# Patient Record
Sex: Female | Born: 1995 | Race: White | Hispanic: No | Marital: Single | Smoking: Never smoker
Health system: Southern US, Community
[De-identification: ages and names within clinical notes are randomized; demographics above are authoritative.]

## PROBLEM LIST (undated history)

## (undated) DIAGNOSIS — I514 Myocarditis, unspecified: Secondary | ICD-10-CM

## (undated) HISTORY — DX: Myocarditis, unspecified: I51.4

## (undated) HISTORY — PX: KNEE ARTHROSCOPY WITH ANTERIOR CRUCIATE LIGAMENT (ACL) REPAIR: SHX5644

---

## 2019-09-28 ENCOUNTER — Emergency Department: Payer: 59

## 2019-09-28 ENCOUNTER — Observation Stay
Admission: EM | Admit: 2019-09-28 | Discharge: 2019-09-29 | Disposition: A | Discharge status: Home / Self Care | Payer: 59 | Attending: Internal Medicine | Admitting: Internal Medicine

## 2019-09-28 ENCOUNTER — Observation Stay
Admit: 2019-09-28 | Discharge: 2019-09-28 | Disposition: A | Discharge status: Home / Self Care | Payer: 59 | Attending: Rehabilitative and Restorative Service Providers" | Admitting: Rehabilitative and Restorative Service Providers"

## 2019-09-28 ENCOUNTER — Other Ambulatory Visit: Payer: Self-pay

## 2019-09-28 DIAGNOSIS — I409 Acute myocarditis, unspecified: Secondary | ICD-10-CM | POA: Diagnosis not present

## 2019-09-28 DIAGNOSIS — Z20822 Contact with and (suspected) exposure to covid-19: Secondary | ICD-10-CM | POA: Insufficient documentation

## 2019-09-28 DIAGNOSIS — M7918 Myalgia, other site: Secondary | ICD-10-CM | POA: Diagnosis not present

## 2019-09-28 DIAGNOSIS — I514 Myocarditis, unspecified: Principal | ICD-10-CM | POA: Insufficient documentation

## 2019-09-28 DIAGNOSIS — Z79899 Other long term (current) drug therapy: Secondary | ICD-10-CM | POA: Diagnosis not present

## 2019-09-28 DIAGNOSIS — R519 Headache, unspecified: Secondary | ICD-10-CM | POA: Diagnosis not present

## 2019-09-28 DIAGNOSIS — R079 Chest pain, unspecified: Secondary | ICD-10-CM | POA: Diagnosis present

## 2019-09-28 LAB — COMPREHENSIVE METABOLIC PANEL
ALT: 20 U/L (ref 0–44)
AST: 23 U/L (ref 15–41)
Albumin: 3.7 g/dL (ref 3.5–5.0)
Alkaline Phosphatase: 29 U/L — ABNORMAL LOW (ref 38–126)
Anion gap: 9 (ref 5–15)
BUN: 8 mg/dL (ref 6–20)
CO2: 23 mmol/L (ref 22–32)
Calcium: 9 mg/dL (ref 8.9–10.3)
Chloride: 108 mmol/L (ref 98–111)
Creatinine, Ser: 0.79 mg/dL (ref 0.44–1.00)
GFR calc Af Amer: >60 mL/min (ref >60)
GFR calc non Af Amer: >60 mL/min (ref >60)
Glucose, Bld: 103 mg/dL — ABNORMAL HIGH (ref 70–99)
Potassium: 4 mmol/L (ref 3.5–5.1)
Sodium: 140 mmol/L (ref 135–145)
Total Bilirubin: 0.9 mg/dL (ref 0.3–1.2)
Total Protein: 6.2 g/dL — ABNORMAL LOW (ref 6.5–8.1)

## 2019-09-28 LAB — CBC
HCT: 40 % (ref 36.0–46.0)
Hemoglobin: 14 g/dL (ref 12.0–15.0)
MCH: 31.8 pg (ref 26.0–34.0)
MCHC: 35 g/dL (ref 30.0–36.0)
MCV: 90.9 fL (ref 80.0–100.0)
Platelets: 232 10*3/uL (ref 150–400)
RBC: 4.4 MIL/uL (ref 3.87–5.11)
RDW: 11.9 % (ref 11.5–15.5)
WBC: 7.6 10*3/uL (ref 4.0–10.5)
nRBC: 0 % (ref 0.0–0.2)

## 2019-09-28 LAB — URINE DRUG SCREEN, QUALITATIVE (ARMC ONLY)
Amphetamines, Ur Screen: NOT DETECTED
Barbiturates, Ur Screen: NOT DETECTED
Benzodiazepine, Ur Scrn: NOT DETECTED
Cannabinoid 50 Ng, Ur ~~LOC~~: NOT DETECTED
Cocaine Metabolite,Ur ~~LOC~~: NOT DETECTED
MDMA (Ecstasy)Ur Screen: NOT DETECTED
Methadone Scn, Ur: NOT DETECTED
Opiate, Ur Screen: NOT DETECTED
Phencyclidine (PCP) Ur S: NOT DETECTED
Tricyclic, Ur Screen: NOT DETECTED

## 2019-09-28 LAB — PROTIME-INR
INR: 1 (ref 0.8–1.2)
Prothrombin Time: 12.4 seconds (ref 11.4–15.2)

## 2019-09-28 LAB — APTT: aPTT: <24 seconds — ABNORMAL LOW (ref 24–36)

## 2019-09-28 LAB — SARS CORONAVIRUS 2 BY RT PCR (HOSPITAL ORDER, PERFORMED IN ~~LOC~~ HOSPITAL LAB): SARS Coronavirus 2: NEGATIVE

## 2019-09-28 LAB — TROPONIN I (HIGH SENSITIVITY)
Troponin I (High Sensitivity): 888 ng/L (ref <18)
Troponin I (High Sensitivity): 1129 ng/L (ref <18)
Troponin I (High Sensitivity): 1584 ng/L (ref <18)

## 2019-09-28 LAB — SEDIMENTATION RATE: Sed Rate: 6 mm/hr (ref 0–20)

## 2019-09-28 LAB — TSH: TSH: 4.393 u[IU]/mL (ref 0.350–4.500)

## 2019-09-28 LAB — HCG, QUANTITATIVE, PREGNANCY: hCG, Beta Chain, Quant, S: 3 m[IU]/mL (ref <5)

## 2019-09-28 LAB — HIV ANTIBODY (ROUTINE TESTING W REFLEX): HIV Screen 4th Generation wRfx: NONREACTIVE

## 2019-09-28 LAB — C-REACTIVE PROTEIN: CRP: 0.7 mg/dL (ref <1.0)

## 2019-09-28 MED ORDER — SODIUM CHLORIDE 0.9% FLUSH
3.0000 mL | Freq: Two times a day (BID) | INTRAVENOUS | Status: DC
  Administered 2019-09-28: 3 mL via INTRAVENOUS

## 2019-09-28 MED ORDER — HEPARIN BOLUS VIA INFUSION
4000.0000 [IU] | Freq: Once | INTRAVENOUS | Status: DC
  Filled 2019-09-28: qty 4000

## 2019-09-28 MED ORDER — ASPIRIN 81 MG PO CHEW
324.0000 mg | CHEWABLE_TABLET | Freq: Once | ORAL | Status: AC
  Administered 2019-09-28: 324 mg via ORAL
  Filled 2019-09-28: qty 4

## 2019-09-28 MED ORDER — ACETAMINOPHEN 325 MG PO TABS
650.0000 mg | ORAL_TABLET | Freq: Four times a day (QID) | ORAL | Status: DC | PRN
  Administered 2019-09-28: 650 mg via ORAL
  Filled 2019-09-28: qty 2

## 2019-09-28 MED ORDER — SODIUM CHLORIDE 0.9% FLUSH: 10.0000 mL | INTRAVENOUS | Status: DC | PRN

## 2019-09-28 MED ORDER — ASPIRIN EC 81 MG PO TBEC: 81.0000 mg | DELAYED_RELEASE_TABLET | Freq: Every day | ORAL | Status: DC

## 2019-09-28 MED ORDER — ENOXAPARIN SODIUM 40 MG/0.4ML ~~LOC~~ SOLN
40.0000 mg | SUBCUTANEOUS | Status: DC
  Administered 2019-09-29: 40 mg via SUBCUTANEOUS
  Filled 2019-09-28: qty 0.4

## 2019-09-28 MED ORDER — SODIUM CHLORIDE 0.9% FLUSH
10.0000 mL | Freq: Two times a day (BID) | INTRAVENOUS | Status: DC
  Administered 2019-09-28 (×2): 10 mL

## 2019-09-28 MED ORDER — HEPARIN (PORCINE) 25000 UT/250ML-% IV SOLN: 14.0000 [IU]/kg/h | INTRAVENOUS | Status: DC

## 2019-09-28 MED ORDER — ACETAMINOPHEN 650 MG RE SUPP: 650.0000 mg | Freq: Four times a day (QID) | RECTAL | Status: DC | PRN

## 2019-09-28 MED ORDER — IOHEXOL 350 MG/ML SOLN
100.0000 mL | Freq: Once | INTRAVENOUS | Status: AC | PRN
  Administered 2019-09-28: 70 mL via INTRAVENOUS

## 2019-09-28 MED ORDER — SODIUM CHLORIDE 0.9% FLUSH: 3.0000 mL | INTRAVENOUS | Status: DC | PRN

## 2019-09-28 MED ORDER — ONDANSETRON HCL 4 MG/2ML IJ SOLN: 4.0000 mg | Freq: Four times a day (QID) | INTRAMUSCULAR | Status: DC | PRN

## 2019-09-28 MED ORDER — SODIUM CHLORIDE 0.9 % IV SOLN: 250.0000 mL | INTRAVENOUS | Status: DC | PRN

## 2019-09-28 MED ORDER — ONDANSETRON HCL 4 MG PO TABS: 4.0000 mg | ORAL_TABLET | Freq: Four times a day (QID) | ORAL | Status: DC | PRN

## 2019-09-28 MED ORDER — DESOGESTREL-ETHINYL ESTRADIOL 0.15-30 MG-MCG PO TABS: 1.0000 | ORAL_TABLET | ORAL | Status: DC

## 2019-09-28 MED ORDER — HEPARIN (PORCINE) 25000 UT/250ML-% IV SOLN: 950.0000 [IU]/h | INTRAVENOUS | Status: DC

## 2019-09-28 NOTE — H&P (Signed)
History and Physical    Londynn Sonoda AOZ:[id number] DOB: 10/25/1995 DOA: 09/28/2019  PCP: Patient, No Pcp Per   Patient coming from: Home I have personally briefly reviewed patient's old medical records in [facility]  Chief Complaint: Chest pain  HPI: Treena Cosman is a 24 y.o. female with no significant past medical history who presents to the emergency room for evaluation of chest pain.  Chest pain started at about 3 PM 1 day prior to her admission.  Pain was midsternal, rated 8 x 10 in intensity at its worse and was nonradiating.  She denied having any nausea, vomiting, diaphoresis or palpitations.  Patient decided to come to the emergency room to get checked out due to the persistence of her symptoms.  She states that she had moved into a new apartment the day prior and thought her pain was muscular, the left.  She has no lower extremity pain or swelling.  She had a CT angiogram of the chest which was negative for PE. High intensity troponin x2 was elevated 88 >> 1129 Patient has had myalgias and headaches but denies having any fever or chills.  She states that she was vaccinated in March against the COVID-19 virus and was infected with a virus about a year ago. She has no family history of autoimmune disease, no family history of venous thromboembolic disease or coronary artery disease. At the time of my history and physical she is chest pain-free. Twelve-lead EKG reviewed by me shows sinus rhythm Chest x-ray reviewed by me shows no acute abnormality.  ED Course: Patient is a 24 year old Caucasian female who presents to the emergency room for evaluation of midsternal chest pain that has been persistent.  Patient noted to have elevated troponin levels and will be admitted to the hospital for possible myocarditis.  Cardiology consult has been placed.  Review of Systems: As per HPI otherwise 10 point review of systems negative.    History reviewed. No pertinent past medical  history.   No Known Allergies  Family History  Problem Relation Age of Onset  . Hypertension Mother   . Hypertension Father      Prior to Admission medications   Medication Sig Start Date End Date Taking? Authorizing Provider  desogestrel-ethinyl estradiol (APRI) 0.15-30 MG-MCG tablet Take 1 tablet by mouth as directed.   Yes [provider]    Physical Exam: Vitals:   09/28/19 0715 09/28/19 0815 09/28/19 0830 09/28/19 0950  BP: (!) 116/58 (!) 108/92 (!) 112/61   Pulse: 60 72 65 56  Resp: 20 21 20 19   Temp:      TempSrc:      SpO2: 99% 100% 100% 100%  Weight:      Height:         Vitals:   09/28/19 0715 09/28/19 0815 09/28/19 0830 09/28/19 0950  BP: (!) 116/58 (!) 108/92 (!) 112/61   Pulse: 60 72 65 56  Resp: 20 21 20 19   Temp:      TempSrc:      SpO2: 99% 100% 100% 100%  Weight:      Height:        Constitutional: NAD, alert and oriented x 3 Eyes: PERRL, lids and conjunctivae normal ENMT: Mucous membranes are moist.  Neck: normal, supple, no masses, no thyromegaly Respiratory: clear to auscultation bilaterally, no wheezing, no crackles. Normal respiratory effort. No accessory muscle use.  Cardiovascular: Regular rate and rhythm, no murmurs / rubs / gallops. No extremity edema. 2+ pedal pulses.  No carotid bruits.  Chest pain is nonreproducible Abdomen: no tenderness, no masses palpated. No hepatosplenomegaly. Bowel sounds positive.  Musculoskeletal: no clubbing / cyanosis. No joint deformity upper and lower extremities.  Skin: no rashes, lesions, ulcers.  Neurologic: No gross focal neurologic deficit. Psychiatric: Normal mood and affect.   Labs on Admission: I have personally reviewed following labs and imaging studies  CBC: Recent Labs  Lab 09/28/19 0050  WBC 7.6  HGB 14.0  HCT 40.0  MCV 90.9  PLT 232   Basic Metabolic Panel: Recent Labs  Lab 09/28/19 0050  NA 140  K 4.0  CL 108  CO2 23  GLUCOSE 103*  BUN 8  CREATININE 0.79   CALCIUM 9.0   GFR: Estimated Creatinine Clearance: 107.3 mL/min (by C-G formula based on SCr of 0.79 mg/dL). Liver Function Tests: Recent Labs  Lab 09/28/19 0050  AST 23  ALT 20  ALKPHOS 29*  BILITOT 0.9  PROT 6.2*  ALBUMIN 3.7   No results for input(s): LIPASE, AMYLASE in the last 168 hours. No results for input(s): AMMONIA in the last 168 hours. Coagulation Profile: Recent Labs  Lab 09/28/19 0842  INR 1.0   Cardiac Enzymes: No results for input(s): CKTOTAL, CKMB, CKMBINDEX, TROPONINI in the last 168 hours. BNP (last 3 results) No results for input(s): PROBNP in the last 8760 hours. HbA1C: No results for input(s): HGBA1C in the last 72 hours. CBG: No results for input(s): GLUCAP in the last 168 hours. Lipid Profile: No results for input(s): CHOL, HDL, LDLCALC, TRIG, CHOLHDL, LDLDIRECT in the last 72 hours. Thyroid Function Tests: No results for input(s): TSH, T4TOTAL, FREET4, T3FREE, THYROIDAB in the last 72 hours. Anemia Panel: No results for input(s): VITAMINB12, FOLATE, FERRITIN, TIBC, IRON, RETICCTPCT in the last 72 hours. Urine analysis: No results found for: COLORURINE, APPEARANCEUR, LABSPEC, PHURINE, GLUCOSEU, HGBUR, BILIRUBINUR, KETONESUR, PROTEINUR, UROBILINOGEN, NITRITE, LEUKOCYTESUR  Radiological Exams on Admission: DG Chest 2 View  Result Date: 09/28/2019 CLINICAL DATA:  Headache and body aches, chest pain EXAM: CHEST - 2 VIEW COMPARISON:  None. FINDINGS: The heart size and mediastinal contours are within normal limits. Both lungs are clear. The visualized skeletal structures are unremarkable. IMPRESSION: No active cardiopulmonary disease. Electronically Signed   By: Sharlet Salina M.D.   On: 09/28/2019 01:19   CT Angio Chest PE W and/or Wo Contrast  Result Date: 09/28/2019 CLINICAL DATA:  Acute onset of chest pain this morning. EXAM: CT ANGIOGRAPHY CHEST WITH CONTRAST TECHNIQUE: Multidetector CT imaging of the chest was performed using the standard  protocol during bolus administration of intravenous contrast. Multiplanar CT image reconstructions and MIPs were obtained to evaluate the vascular anatomy. CONTRAST:  12mL OMNIPAQUE IOHEXOL 350 MG/ML SOLN COMPARISON:  Chest x-ray, same date. FINDINGS: Cardiovascular: The heart is normal in size. No pericardial effusion. The aorta is normal in caliber. No dissection. The pulmonary arterial tree is well opacified. No filling defects to suggest pulmonary embolism. Mediastinum/Nodes: No mediastinal or hilar mass or adenopathy. The esophagus is unremarkable. Lungs/Pleura: No acute pulmonary findings. No pulmonary lesions or pleural effusion. Upper Abdomen: No significant upper abdominal findings. Musculoskeletal: No significant bony findings. Review of the MIP images confirms the above findings. IMPRESSION: 1. No CT findings for pulmonary embolism. 2. Normal thoracic aorta. 3. No acute pulmonary findings or worrisome pulmonary lesions. Electronically Signed   By: Rudie Meyer M.D.   On: 09/28/2019 05:38    EKG: Independently reviewed.  Sinus rhythm  Assessment/Plan Principal Problem:   Myocarditis (HCC)  Myocarditis Unclear etiology may be viral Continue supportive care with nonsteroidal anti-inflammatory agents and Tylenol as needed for myalgias We will request cardiology consult Place patient on daily aspirin We will obtain work-up for possible autoimmune disease Obtain 2D echocardiogram to assess LVEF  DVT prophylaxis: Lovenox Code Status: Full Family Communication: Greater than 50% of time was spent discussing plan of care with patient at the bedside.  She verbalizes understanding and agrees with the plan Disposition Plan: Back to previous home environment Consults called: Cardiology    Hardeep Reetz MD [facility]     09/28/2019, 9:58 AM

## 2019-09-28 NOTE — ED Notes (Signed)
Pt cleared by ER MD to have ice chips. Pt given ice chips by this RN at this time

## 2019-09-28 NOTE — Progress Notes (Signed)
Report called to [facility] on 2A.

## 2019-09-28 NOTE — ED Notes (Signed)
Called lab for ETA on repeat Trop I, unable to give me a time.

## 2019-09-28 NOTE — Progress Notes (Signed)
ANTICOAGULATION CONSULT NOTE - Initial Consult  Pharmacy Consult for Heparin Indication: ACS/STEMI  No Known Allergies  Patient Measurements: Height: 5\' 7"  (170.2 cm) Weight: 68 kg (150 lb) IBW/kg (Calculated) : 61.6 HEPARIN DW (KG): 68  Vital Signs: Temp: 99.3 F (37.4 C) (07/23 0047) Temp Source: Oral (07/23 0047) BP: 126/76 (07/23 0432) Pulse Rate: 90 (07/23 0432)  Labs: Recent Labs    09/28/19 0050 09/28/19 0321  HGB 14.0  --   HCT 40.0  --   PLT 232  --   CREATININE 0.79  --   TROPONINIHS 888* 1,129*    Estimated Creatinine Clearance: 107.3 mL/min (by C-G formula based on SCr of 0.79 mg/dL).   Medical History: History reviewed. No pertinent past medical history.  Medications:  (Not in a hospital admission)   Assessment: No anticoagulants PTA.  Baseline labs ordered.  Initiating Heparin for ACS.  Goal of Therapy:  Heparin level 0.3-0.7 units/ml Monitor platelets by anticoagulation protocol: Yes   Plan:  Heparin 4000 units x 1 then infusion at 950 units/hr Check HL 6 hours after heparin started  09/30/19 A 09/28/2019,5:56 AM

## 2019-09-28 NOTE — ED Provider Notes (Signed)
[facility] Emergency Department Provider Note  ____________________________________________   First MD Initiated Contact with Patient 09/28/19 0151     (approximate)  I have reviewed the triage vital signs and the nursing notes.   HISTORY  Chief Complaint Chest Pain   HPI Carol Moody is a 24 y.o. female with previous history of COVID-19 infection last year, receive COVID-19 vaccine March of this year presents to the emergency department with a 1 day history of generalized body aches and headache overall myalgia.  Patient states that 3:00 PM today she started to experience chest pain which she describes as tightness current pain score of 3 out of 10.  Patient denies any cardiac history.  Patient denies any personal familial history of ACS, DVT  or PE.  Patient denies any fever.  Oral temperature 99.3 on arrival to the emergency department.  Patient denies any lower extremity pain or swelling.        Past medical history COVID-19 infection 1 year ago There are no problems to display for this patient.     Prior to Admission medications   Medication Sig Start Date End Date Taking? Authorizing Provider  desogestrel-ethinyl estradiol (APRI) 0.15-30 MG-MCG tablet Take 1 tablet by mouth as directed.   Yes [provider]    Allergies Patient has no known allergies.  No family history on file.  Social History Social History   Tobacco Use  . Smoking status: Not on file  Substance Use Topics  . Alcohol use: Not on file  . Drug use: Not on file    Review of Systems Constitutional: No fever/chills Eyes: No visual changes. ENT: No sore throat. Cardiovascular: Denies chest pain. Respiratory: Denies shortness of breath. Gastrointestinal: No abdominal pain.  No nausea, no vomiting.  No diarrhea.  No constipation. Genitourinary: Negative for dysuria. Musculoskeletal: Negative for neck pain.  Negative for back pain. Integumentary: Negative  for rash. Neurological: Negative for headaches, focal weakness or numbness.   ____________________________________________   PHYSICAL EXAM:  VITAL SIGNS: ED Triage Vitals [09/28/19 0047]  Enc Vitals Group     BP (!) 123/88     Pulse Rate 70     Resp 20     Temp 99.3 F (37.4 C)     Temp Source Oral     SpO2 99 %     Weight 68 kg (150 lb)     Height 1.702 m (5\' 7" )     Head Circumference      Peak Flow      Pain Score 3     Pain Loc      Pain Edu?      Excl. in GC?     Constitutional: Alert and oriented.  Eyes: Conjunctivae are normal.  Head: Atraumatic. Mouth/Throat: Patient is wearing a mask. Neck: No stridor.  No meningeal signs.   Cardiovascular: Normal rate, regular rhythm. Good peripheral circulation. Grossly normal heart sounds. Respiratory: Normal respiratory effort.  No retractions. Gastrointestinal: Soft and nontender. No distention.  Musculoskeletal: No lower extremity tenderness nor edema. No gross deformities of extremities. Neurologic:  Normal speech and language. No gross focal neurologic deficits are appreciated.  Skin:  Skin is warm, dry and intact. Psychiatric: Mood and affect are normal. Speech and behavior are normal.  ____________________________________________   LABS (all labs ordered are listed, but only abnormal results are displayed)  Labs Reviewed  COMPREHENSIVE METABOLIC PANEL - Abnormal; Notable for the following components:      Result Value  Glucose, Bld 103 (*)    Total Protein 6.2 (*)    Alkaline Phosphatase 29 (*)    All other components within normal limits  TROPONIN I (HIGH SENSITIVITY) - Abnormal; Notable for the following components:   Troponin I (High Sensitivity) 888 (*)    All other components within normal limits  TROPONIN I (HIGH SENSITIVITY) - Abnormal; Notable for the following components:   Troponin I (High Sensitivity) 1,129 (*)    All other components within normal limits  SARS CORONAVIRUS 2 BY RT PCR  (HOSPITAL ORDER, PERFORMED IN [facility])  CBC  HCG, QUANTITATIVE, PREGNANCY  APTT  PROTIME-INR   ____________________________________________  EKG  ED ECG REPORT I, [place] Marvelene Stoneberg, the attending physician, personally viewed and interpreted this ECG.   Date: 09/28/2019  EKG Time: 2:07 AM  Rate: 88  Rhythm: Normal sinus rhythm  Axis: Normal  Intervals: Normal  ST&T Change: None  ____________________________________________  RADIOLOGY I, [place] N Dorin [place], personally viewed and evaluated these images (plain radiographs) as part of my medical decision making, as well as reviewing the written report by the radiologist.  ED MD interpretation: No active cardiopulmonary disease noted on chest x-ray per radiologist  CT angiogram chest also negative  Official radiology report(s): DG Chest 2 View  Result Date: 09/28/2019 CLINICAL DATA:  Headache and body aches, chest pain EXAM: CHEST - 2 VIEW COMPARISON:  None. FINDINGS: The heart size and mediastinal contours are within normal limits. Both lungs are clear. The visualized skeletal structures are unremarkable. IMPRESSION: No active cardiopulmonary disease. Electronically Signed   By: Sharlet Salina M.D.   On: 09/28/2019 01:19   CT Angio Chest PE W and/or Wo Contrast  Result Date: 09/28/2019 CLINICAL DATA:  Acute onset of chest pain this morning. EXAM: CT ANGIOGRAPHY CHEST WITH CONTRAST TECHNIQUE: Multidetector CT imaging of the chest was performed using the standard protocol during bolus administration of intravenous contrast. Multiplanar CT image reconstructions and MIPs were obtained to evaluate the vascular anatomy. CONTRAST:  32mL OMNIPAQUE IOHEXOL 350 MG/ML SOLN COMPARISON:  Chest x-ray, same date. FINDINGS: Cardiovascular: The heart is normal in size. No pericardial effusion. The aorta is normal in caliber. No dissection. The pulmonary arterial tree is well opacified. No filling defects to suggest pulmonary embolism.  Mediastinum/Nodes: No mediastinal or hilar mass or adenopathy. The esophagus is unremarkable. Lungs/Pleura: No acute pulmonary findings. No pulmonary lesions or pleural effusion. Upper Abdomen: No significant upper abdominal findings. Musculoskeletal: No significant bony findings. Review of the MIP images confirms the above findings. IMPRESSION: 1. No CT findings for pulmonary embolism. 2. Normal thoracic aorta. 3. No acute pulmonary findings or worrisome pulmonary lesions. Electronically Signed   By: Rudie Meyer M.D.   On: 09/28/2019 05:38      Procedures   ____________________________________________   INITIAL IMPRESSION / MDM / ASSESSMENT AND PLAN / ED COURSE  As part of my medical decision making, I reviewed the following data within the electronic MEDICAL RECORD NUMBER   24 year old female presented with above-stated history and physical exam a differential diagnosis including but not limited to myocarditis, pericarditis, ACS, pulmonary emboli.  Laboratory data notable for elevated troponin of 888.  EKG revealed no evidence of ischemia or infarction.  CT scan revealed no evidence of pulmonary emboli or any other gross abnormality.  Repeat troponin obtained was 1129.  Patient discussed with Dr. Lady Gary cardiologist on-call.  Dr. Lady Gary advised no heparin at this time.  Patient discussed with Dr. Para March for hospital admission  further evaluation and management.  ____________________________________________  FINAL CLINICAL IMPRESSION(S) / ED DIAGNOSES  Final diagnoses:  Acute myocarditis, unspecified myocarditis type     MEDICATIONS GIVEN DURING THIS VISIT:  Medications  sodium chloride flush (NS) 0.9 % injection 10-40 mL (has no administration in time range)  sodium chloride flush (NS) 0.9 % injection 10-40 mL (has no administration in time range)  iohexol (OMNIPAQUE) 350 MG/ML injection 100 mL (70 mLs Intravenous Contrast Given 09/28/19 0449)  aspirin chewable tablet 324 mg (324 mg Oral  Given 09/28/19 0515)     ED Discharge Orders    None      *Please note:  Spencer Peterkin was evaluated in Emergency Department on 09/28/2019 for the symptoms described in the history of present illness. She was evaluated in the context of the global COVID-19 pandemic, which necessitated consideration that the patient might be at risk for infection with the SARS-CoV-2 virus that causes COVID-19. Institutional protocols and algorithms that pertain to the evaluation of patients at risk for COVID-19 are in a state of rapid change based on information released by regulatory bodies including the CDC and federal and state organizations. These policies and algorithms were followed during the patient's care in the ED.  Some ED evaluations and interventions may be delayed as a result of limited staffing during and after the pandemic.*  Note:  This document was prepared using Dragon voice recognition software and may include unintentional dictation errors.   Darci Current, MD 09/28/19 [phone]

## 2019-09-28 NOTE — ED Triage Notes (Signed)
Pt in with co chest pain since 1500 today, no cardiac hx. States has had headache and body aches recently. Denies any fever or respiratory symptoms.

## 2019-09-28 NOTE — Progress Notes (Signed)
Spoke with [facility], Patients Boyfriend Loralie Champagne to stay over One night. They are aware of this and that is for only one night

## 2019-09-28 NOTE — Consult Note (Signed)
CARDIOLOGY CONSULT NOTE               Patient ID: Carol Moody MRN: [id number] DOB/AGE: [birth year] 24 y.o.  Admit date: 09/28/2019 Referring Physician Dr. Bayard Males  Primary Physician n/a Primary Cardiologist n/a Reason for Consultation Chest pain, elevated troponin   HPI: Carol Moody is a 24 year old female with a past medical history significant for COVID-19 infection who presented to the ED on 09/28/19 for a 1 day history of generalized fatigue with associated chest tightness.  She reports waking up yesterday morning, feeling "a little off" and then developed chest pain around 3pm yesterday afternoon.  Workup in the ED has been significant for high sensitivity troponin elevated x 2 at 88 and 1129 respectively, ECG revealing sinus rhythm with no acute ischemic changes, chest xray negative for acute cardiopulmonary disease, and chest CT negative for an acute PE.   On exam, Carol Moody is currently asymptomatic and reports complete resolution of chest pain a few hours after onset.  She was given aspirin but no other medications.  She denies known sick contacts, but recently traveled here from [place].  She denies lower extremity swelling, orthopnea, or PND.  She denies palpitations or heart racing sensation.  She denies dizziness, lightheadedness, or syncopal/presyncopal episodes.   Review of systems complete and found to be negative unless listed above     History reviewed. No pertinent past medical history.    (Not in a hospital admission)  Social History   Socioeconomic History  . Marital status: Single    Spouse name: Not on file  . Number of children: Not on file  . Years of education: Not on file  . Highest education level: Not on file  Occupational History  . Not on file  Tobacco Use  . Smoking status: Not on file  Substance and Sexual Activity  . Alcohol use: Not on file  . Drug use: Not on file  . Sexual activity: Not on file  Other Topics Concern  . Not on  file  Social History Narrative  . Not on file   Social Determinants of Health   Financial Resource Strain:   . Difficulty of Paying Living Expenses:   Food Insecurity:   . Worried About Programme researcher, broadcasting/film/video in the Last Year:   . Barista in the Last Year:   Transportation Needs:   . Freight forwarder (Medical):   Marland Kitchen Lack of Transportation (Non-Medical):   Physical Activity:   . Days of Exercise per Week:   . Minutes of Exercise per Session:   Stress:   . Feeling of Stress :   Social Connections:   . Frequency of Communication with Friends and Family:   . Frequency of Social Gatherings with Friends and Family:   . Attends Religious Services:   . Active Member of Clubs or Organizations:   . Attends Banker Meetings:   Marland Kitchen Marital Status:   Intimate Partner Violence:   . Fear of Current or Ex-Partner:   . Emotionally Abused:   Marland Kitchen Physically Abused:   . Sexually Abused:     No family history on file.    Review of systems complete and found to be negative unless listed above      PHYSICAL EXAM  General: Well developed, well nourished, in no acute distress HEENT:  Normocephalic and atramatic Neck:  No JVD.  Lungs: Clear bilaterally to auscultation and percussion. Heart: HRRR . Normal S1 and  S2 without gallops or murmurs.  Abdomen: Bowel sounds are positive, abdomen soft and non-tender  Msk:  Back normal. Normal strength and tone for age. Extremities: No clubbing, cyanosis or edema.   Neuro: Alert and oriented X 3. Psych:  Good affect, responds appropriately  Labs:   Lab Results  Component Value Date   WBC 7.6 09/28/2019   HGB 14.0 09/28/2019   HCT 40.0 09/28/2019   MCV 90.9 09/28/2019   PLT 232 09/28/2019    Recent Labs  Lab 09/28/19 0050  NA 140  K 4.0  CL 108  CO2 23  BUN 8  CREATININE 0.79  CALCIUM 9.0  PROT 6.2*  BILITOT 0.9  ALKPHOS 29*  ALT 20  AST 23  GLUCOSE 103*   No results found for: CKTOTAL, CKMB, CKMBINDEX,  TROPONINI No results found for: CHOL No results found for: HDL No results found for: LDLCALC No results found for: TRIG No results found for: CHOLHDL No results found for: LDLDIRECT    Radiology: DG Chest 2 View  Result Date: 09/28/2019 CLINICAL DATA:  Headache and body aches, chest pain EXAM: CHEST - 2 VIEW COMPARISON:  None. FINDINGS: The heart size and mediastinal contours are within normal limits. Both lungs are clear. The visualized skeletal structures are unremarkable. IMPRESSION: No active cardiopulmonary disease. Electronically Signed   By: Sharlet Salina M.D.   On: 09/28/2019 01:19   CT Angio Chest PE W and/or Wo Contrast  Result Date: 09/28/2019 CLINICAL DATA:  Acute onset of chest pain this morning. EXAM: CT ANGIOGRAPHY CHEST WITH CONTRAST TECHNIQUE: Multidetector CT imaging of the chest was performed using the standard protocol during bolus administration of intravenous contrast. Multiplanar CT image reconstructions and MIPs were obtained to evaluate the vascular anatomy. CONTRAST:  45mL OMNIPAQUE IOHEXOL 350 MG/ML SOLN COMPARISON:  Chest x-ray, same date. FINDINGS: Cardiovascular: The heart is normal in size. No pericardial effusion. The aorta is normal in caliber. No dissection. The pulmonary arterial tree is well opacified. No filling defects to suggest pulmonary embolism. Mediastinum/Nodes: No mediastinal or hilar mass or adenopathy. The esophagus is unremarkable. Lungs/Pleura: No acute pulmonary findings. No pulmonary lesions or pleural effusion. Upper Abdomen: No significant upper abdominal findings. Musculoskeletal: No significant bony findings. Review of the MIP images confirms the above findings. IMPRESSION: 1. No CT findings for pulmonary embolism. 2. Normal thoracic aorta. 3. No acute pulmonary findings or worrisome pulmonary lesions. Electronically Signed   By: Rudie Meyer M.D.   On: 09/28/2019 05:38    EKG: Normal sinus rhythm at a ventricular rate of 88bpm with no evidence  of acute ischemia, arrhythmias, or ectopy.   ASSESSMENT AND PLAN:  1.  Chest pain/elevated troponin   -Likely myocarditis in the setting of a potential viral infection with reports of generalized fatigue, body aches  -Will obtain echocardiogram to assess LV function   -Recommend to remain on telemetry   The history, physical exam findings, and plan of care were all discussed with Dr. Harold Hedge, and all decision making was made in collaboration.   Signed: Andi Hence PA-C 09/28/2019, 8:04 AM

## 2019-09-29 ENCOUNTER — Encounter: Payer: Self-pay | Admitting: Internal Medicine

## 2019-09-29 DIAGNOSIS — I409 Acute myocarditis, unspecified: Secondary | ICD-10-CM | POA: Diagnosis not present

## 2019-09-29 LAB — BASIC METABOLIC PANEL
Anion gap: 9 (ref 5–15)
BUN: 7 mg/dL (ref 6–20)
CO2: 23 mmol/L (ref 22–32)
Calcium: 8.7 mg/dL — ABNORMAL LOW (ref 8.9–10.3)
Chloride: 108 mmol/L (ref 98–111)
Creatinine, Ser: 0.65 mg/dL (ref 0.44–1.00)
GFR calc Af Amer: >60 mL/min (ref >60)
GFR calc non Af Amer: >60 mL/min (ref >60)
Glucose, Bld: 95 mg/dL (ref 70–99)
Potassium: 3.6 mmol/L (ref 3.5–5.1)
Sodium: 140 mmol/L (ref 135–145)

## 2019-09-29 LAB — ANA: Anti Nuclear Antibody (ANA): NEGATIVE

## 2019-09-29 LAB — CBC
HCT: 42.3 % (ref 36.0–46.0)
Hemoglobin: 14.7 g/dL (ref 12.0–15.0)
MCH: 31.7 pg (ref 26.0–34.0)
MCHC: 34.8 g/dL (ref 30.0–36.0)
MCV: 91.2 fL (ref 80.0–100.0)
Platelets: 234 10*3/uL (ref 150–400)
RBC: 4.64 MIL/uL (ref 3.87–5.11)
RDW: 12.2 % (ref 11.5–15.5)
WBC: 7.5 10*3/uL (ref 4.0–10.5)
nRBC: 0 % (ref 0.0–0.2)

## 2019-09-29 LAB — ECHOCARDIOGRAM COMPLETE
Area-P 1/2: 2.35 cm2
Height: 67 in
S' Lateral: 3.12 cm
Weight: 2419.2 oz

## 2019-09-29 LAB — TROPONIN I (HIGH SENSITIVITY): Troponin I (High Sensitivity): 1456 ng/L (ref <18)

## 2019-09-29 MED ORDER — IBUPROFEN 400 MG PO TABS
400.0000 mg | ORAL_TABLET | Freq: Four times a day (QID) | ORAL | Status: AC | PRN
Start: 2019-09-29 — End: ?

## 2019-09-29 NOTE — Progress Notes (Signed)
Troponin 1456 this am improved from previous. OK for discharge.

## 2019-09-29 NOTE — Progress Notes (Signed)
Patient Name: Carol Moody Date of Encounter: 09/29/2019  Hospital Problem List     Principal Problem:   Myocarditis [facility])    Patient Profile     24 year old female with a past medical history significant for COVID-19 infection who presented to the ED on 09/28/19 for a 1 day history of generalized fatigue with associated chest tightness.  She reports waking up yesterday morning, feeling "a little off" and then developed chest pain around 3pm yesterday afternoon.  Workup in the ED has been significant for high sensitivity troponin elevated x 2 at 88 and 1129 respectively, ECG revealing sinus rhythm with no acute ischemic changes, chest xray negative for acute cardiopulmonary disease, and chest CT negative for an acute PE.   She denies known sick contacts, but recently traveled here from [place].  She denies lower extremity swelling, orthopnea, or PND.  She denies palpitations or heart racing sensation.  She denies dizziness, lightheadedness, or syncopal/presyncopal episodes.   Subjective   Mild positional chest heaviness.  Inpatient Medications    . aspirin EC  81 mg Oral Daily  . desogestrel-ethinyl estradiol  1 tablet Oral UD  . enoxaparin (LOVENOX) injection  40 mg Subcutaneous Q24H  . sodium chloride flush  10-40 mL Intracatheter Q12H  . sodium chloride flush  3 mL Intravenous Q12H    Vital Signs    Vitals:   09/28/19 2136 09/29/19 0220 09/29/19 0221 09/29/19 0754  BP: 126/70  122/72 116/75  Pulse: 68  57 57  Resp: 20  18 18   Temp: 99.7 F (37.6 C)  99.1 F (37.3 C) 98.8 F (37.1 C)  TempSrc: Oral  Oral Oral  SpO2:   99% 99%  Weight: 68.6 kg 68.5 kg    Height: 5\' 7"  (1.702 m)       Intake/Output Summary (Last 24 hours) at 09/29/2019 0816 Last data filed at 09/29/2019 0000 Gross per 24 hour  Intake 10 ml  Output 0 ml  Net 10 ml   Filed Weights   09/28/19 0047 09/28/19 2136 09/29/19 0220  Weight: 68 kg 68.6 kg 68.5 kg    Physical Exam    GEN: Well nourished,  well developed, in no acute distress.  HEENT: normal.  Neck: Supple, no JVD, carotid bruits, or masses. Cardiac: RRR, no murmurs, rubs, or gallops. No clubbing, cyanosis, edema.  Radials/DP/PT 2+ and equal bilaterally.  Respiratory:  Respirations regular and unlabored, clear to auscultation bilaterally. GI: Soft, nontender, nondistended, BS + x 4. MS: no deformity or atrophy. Skin: warm and dry, no rash. Neuro:  Strength and sensation are intact. Psych: Normal affect.  Labs    CBC Recent Labs    09/28/19 0050 09/29/19 0531  WBC 7.6 7.5  HGB 14.0 14.7  HCT 40.0 42.3  MCV 90.9 91.2  PLT 232 234   Basic Metabolic Panel Recent Labs    09/30/19 0050 09/29/19 0531  NA 140 140  K 4.0 3.6  CL 108 108  CO2 23 23  GLUCOSE 103* 95  BUN 8 7  CREATININE 0.79 0.65  CALCIUM 9.0 8.7*   Liver Function Tests Recent Labs    09/28/19 0050  AST 23  ALT 20  ALKPHOS 29*  BILITOT 0.9  PROT 6.2*  ALBUMIN 3.7   No results for input(s): LIPASE, AMYLASE in the last 72 hours. Cardiac Enzymes No results for input(s): CKTOTAL, CKMB, CKMBINDEX, TROPONINI in the last 72 hours. BNP No results for input(s): BNP in the last 72 hours. D-Dimer  No results for input(s): DDIMER in the last 72 hours. Hemoglobin A1C No results for input(s): HGBA1C in the last 72 hours. Fasting Lipid Panel No results for input(s): CHOL, HDL, LDLCALC, TRIG, CHOLHDL, LDLDIRECT in the last 72 hours. Thyroid Function Tests Recent Labs    09/28/19 1507  TSH 4.393    Telemetry    Normal sinus rhythm  ECG    Normal sinus rhythm with no ischemia or arrhythmia  Radiology    DG Chest 2 View  Result Date: 09/28/2019 CLINICAL DATA:  Headache and body aches, chest pain EXAM: CHEST - 2 VIEW COMPARISON:  None. FINDINGS: The heart size and mediastinal contours are within normal limits. Both lungs are clear. The visualized skeletal structures are unremarkable. IMPRESSION: No active cardiopulmonary disease.  Electronically Signed   By: Carol SalinaMichael  Moody M.D.   On: 09/28/2019 01:19   CT Angio Chest PE W and/or Wo Contrast  Result Date: 09/28/2019 CLINICAL DATA:  Acute onset of chest pain this morning. EXAM: CT ANGIOGRAPHY CHEST WITH CONTRAST TECHNIQUE: Multidetector CT imaging of the chest was performed using the standard protocol during bolus administration of intravenous contrast. Multiplanar CT image reconstructions and MIPs were obtained to evaluate the vascular anatomy. CONTRAST:  70mL OMNIPAQUE IOHEXOL 350 MG/ML SOLN COMPARISON:  Chest x-ray, same date. FINDINGS: Cardiovascular: The heart is normal in size. No pericardial effusion. The aorta is normal in caliber. No dissection. The pulmonary arterial tree is well opacified. No filling defects to suggest pulmonary embolism. Mediastinum/Nodes: No mediastinal or hilar mass or adenopathy. The esophagus is unremarkable. Lungs/Pleura: No acute pulmonary findings. No pulmonary lesions or pleural effusion. Upper Abdomen: No significant upper abdominal findings. Musculoskeletal: No significant bony findings. Review of the MIP images confirms the above findings. IMPRESSION: 1. No CT findings for pulmonary embolism. 2. Normal thoracic aorta. 3. No acute pulmonary findings or worrisome pulmonary lesions. Electronically Signed   By: Carol MeyerP.  Moody M.D.   On: 09/28/2019 05:38   ECHOCARDIOGRAM COMPLETE  Result Date: 09/29/2019    ECHOCARDIOGRAM REPORT   [name] Date of Exam: 09/28/2019 Medical Rec #:  [id number]     Height:       67.0 in Accession #:    [id number]    Weight:       151.2 lb Date of Birth:  1995-05-20     BSA:          1.796 m Patient Age:    24 years      BP:           126/70 mmHg Patient Gender: F             HR:           70 bpm. Exam Location:  [facility] Procedure: 2D Echo Indications:     Chest Pain 786.50/R07.9  History:         Patient has no prior history of Echocardiogram examinations.                  Myocarditis.  Sonographer:      Carol Moody Referring Phys:  [id number] Melanee SpryNICOLE L STEPHENS Diagnosing Phys: Harold HedgeKenneth Shahrukh Pasch MD IMPRESSIONS  1. Left ventricular ejection fraction, by estimation, is 60 to 65%. The left ventricle has normal function. The left ventricle has no regional wall motion abnormalities. Left ventricular diastolic parameters were normal.  2. Right ventricular systolic function is normal. The right ventricular size is normal. There is normal pulmonary artery systolic pressure.  3. The  mitral valve is normal in structure. No evidence of mitral valve regurgitation.  4. The aortic valve is tricuspid. Aortic valve regurgitation is not visualized. FINDINGS  Left Ventricle: Left ventricular ejection fraction, by estimation, is 60 to 65%. The left ventricle has normal function. The left ventricle has no regional wall motion abnormalities. The left ventricular internal cavity size was normal in size. There is  no left ventricular hypertrophy. Left ventricular diastolic parameters were normal. Right Ventricle: The right ventricular size is normal. No increase in right ventricular wall thickness. Right ventricular systolic function is normal. There is normal pulmonary artery systolic pressure. The tricuspid regurgitant velocity is 2.31 m/s, and  with an assumed right atrial pressure of 10 mmHg, the estimated right ventricular systolic pressure is 31.3 mmHg. Left Atrium: Left atrial size was normal in size. Right Atrium: Right atrial size was normal in size. Pericardium: There is no evidence of pericardial effusion. Mitral Valve: The mitral valve is normal in structure. No evidence of mitral valve regurgitation. Tricuspid Valve: The tricuspid valve is normal in structure. Tricuspid valve regurgitation is trivial. Aortic Valve: The aortic valve is tricuspid. Aortic valve regurgitation is not visualized. Pulmonic Valve: The pulmonic valve was not well visualized. Pulmonic valve regurgitation is not visualized. Aorta: The aortic root is normal  in size and structure. IAS/Shunts: The interatrial septum was not assessed.  LEFT VENTRICLE PLAX 2D LVIDd:         4.60 cm  Diastology LVIDs:         3.12 cm  LV e' lateral: 21.60 cm/s LV PW:         0.77 cm  LV e' medial:  12.70 cm/s LV IVS:        0.63 cm LVOT diam:     1.80 cm LVOT Area:     2.54 cm  RIGHT VENTRICLE             IVC RV S prime:     12.90 cm/s  IVC diam: 1.74 cm TAPSE (M-mode): 2.1 cm LEFT ATRIUM             Index       RIGHT ATRIUM           Index LA diam:        3.10 cm 1.73 cm/m  RA Area:     12.30 cm LA Vol (A2C):   34.7 ml 19.33 ml/m RA Volume:   28.40 ml  15.82 ml/m LA Vol (A4C):   37.8 ml 21.05 ml/m LA Biplane Vol: 37.3 ml 20.77 ml/m   AORTA Ao Root diam: 2.70 cm MITRAL VALVE               TRICUSPID VALVE MV Area (PHT): 2.35 cm    TR Peak grad:   21.3 mmHg MV Decel Time: 323 msec    TR Vmax:        231.00 cm/s MV A velocity: 60.10 cm/s                            SHUNTS                            Systemic Diam: 1.80 cm Harold Hedge MD Electronically signed by Harold Hedge MD Signature Date/Time: 09/29/2019/8:12:57 AM    Final     Assessment & Plan    24 year old female admitted with chest tightness and generalized fatigue.  Developed  chest pain prior to admission.  EKG was normal.  Serum troponin high-sensitivity was abnormal and had increased to 1500.  Chest x-ray was normal.  EKG again showed no ischemia.  Chest CT is negative.  She had COVID-19 approximately a year and a half ago.  She had the vaccine approximately 3 months ago.  She denies any sick contacts.  Covid 19 was negative.  Sed rate was normal.  Renal function was normal.  Long discussion with patient and companion in the room.  Likely this was a mild myocarditis secondary to a viral syndrome.  At her request we will check another troponin.  Less clinically there is any change, would plan discharge on over-the-counter anti-inflammatories.  Avoid heavy exercise for at least 1 week.  We will see in the office in 1  week.  Signed, Darlin Priestly Breianna Delfino MD 09/29/2019, 8:16 AM  Pager: [phone]

## 2019-09-29 NOTE — Discharge Summary (Signed)
Physician Discharge Summary  Carol Moody HUT:[id number] DOB: November 05, 1995 DOA: 09/28/2019  PCP: Patient, No Pcp Per  Admit date: 09/28/2019 Discharge date: 09/29/2019  Discharge disposition: Home   Recommendations for Outpatient Follow-Up:   Follow-up with Dr. Lady Gary, cardiologist, in 1 week   Discharge Diagnosis:   Principal Problem:   Myocarditis [facility])    Discharge Condition: Stable.  Diet recommendation:  Diet Order            Diet general                       Code Status: Full Code     Hospital Course:   [name] is a 24 y.o. female with no significant past medical history who presented to the emergency room for evaluation of chest pain.  Chest pain was preceded by general body aches.  She was found to have significantly elevated troponins (peaked at 1,584).  EKG was unremarkable.  2D echo was unremarkable.  EF was 60 to 65%.  She was seen in consultation by the cardiologist.  Suspected diagnosis is acute myocarditis.  Cardiology recommended outpatient follow-up in 1 week.  Her chest pain has resolved and she is deemed stable for discharge to home.     Discharge Exam:   Vitals:   09/29/19 0221 09/29/19 0754  BP: 122/72 116/75  Pulse: 57 57  Resp: 18 18  Temp: 99.1 F (37.3 C) 98.8 F (37.1 C)  SpO2: 99% 99%   Vitals:   09/28/19 2136 09/29/19 0220 09/29/19 0221 09/29/19 0754  BP: 126/70  122/72 116/75  Pulse: 68  57 57  Resp: 20  18 18   Temp: 99.7 F (37.6 C)  99.1 F (37.3 C) 98.8 F (37.1 C)  TempSrc: Oral  Oral Oral  SpO2:   99% 99%  Weight: 68.6 kg 68.5 kg    Height: 5\' 7"  (1.702 m)        GEN: NAD SKIN: No rash EYES: EOMI ENT: MMM CV: RRR PULM: CTA B ABD: soft, ND, NT, +BS CNS: AAO x 3, non focal EXT: No edema or tenderness    Her female friend was at the bedside   The results of significant diagnostics from this hospitalization (including imaging, microbiology, ancillary and laboratory) are listed below for  reference.     Procedures and Diagnostic Studies:   DG Chest 2 View  Result Date: 09/28/2019 CLINICAL DATA:  Headache and body aches, chest pain EXAM: CHEST - 2 VIEW COMPARISON:  None. FINDINGS: The heart size and mediastinal contours are within normal limits. Both lungs are clear. The visualized skeletal structures are unremarkable. IMPRESSION: No active cardiopulmonary disease. Electronically Signed   By: M.D.   On: 09/28/2019 01:19   CT Angio Chest PE W and/or Wo Contrast  Result Date: 09/28/2019 CLINICAL DATA:  Acute onset of chest pain this morning. EXAM: CT ANGIOGRAPHY CHEST WITH CONTRAST TECHNIQUE: Multidetector CT imaging of the chest was performed using the standard protocol during bolus administration of intravenous contrast. Multiplanar CT image reconstructions and MIPs were obtained to evaluate the vascular anatomy. CONTRAST:  79mL OMNIPAQUE IOHEXOL 350 MG/ML SOLN COMPARISON:  Chest x-ray, same date. FINDINGS: Cardiovascular: The heart is normal in size. No pericardial effusion. The aorta is normal in caliber. No dissection. The pulmonary arterial tree is well opacified. No filling defects to suggest pulmonary embolism. Mediastinum/Nodes: No mediastinal or hilar mass or adenopathy. The esophagus is unremarkable. Lungs/Pleura: No acute pulmonary findings. No pulmonary  lesions or pleural effusion. Upper Abdomen: No significant upper abdominal findings. Musculoskeletal: No significant bony findings. Review of the MIP images confirms the above findings. IMPRESSION: 1. No CT findings for pulmonary embolism. 2. Normal thoracic aorta. 3. No acute pulmonary findings or worrisome pulmonary lesions. Electronically Signed   By: Rudie MeyerP.  Gallerani M.D.   On: 09/28/2019 05:38   ECHOCARDIOGRAM COMPLETE  Result Date: 09/29/2019    ECHOCARDIOGRAM REPORT   [name] Date of Exam: 09/28/2019 Medical Rec #:  [id number]     Height:       67.0 in Accession #:    [id number]     Weight:       151.2 lb Date of Birth:  09-28-1995     BSA:          1.796 m Patient Age:    24 years      BP:           126/70 mmHg Patient Gender: F             HR:           70 bpm. Exam Location:  [facility] Procedure: 2D Echo Indications:     Chest Pain 786.50/R07.9  History:         Patient has no prior history of Echocardiogram examinations.                  Myocarditis.  Sonographer:     Johnathan HausenSharika Mucker Referring Phys:  [id number] Melanee SpryNICOLE L STEPHENS Diagnosing Phys: Harold HedgeKenneth Fath MD IMPRESSIONS  1. Left ventricular ejection fraction, by estimation, is 60 to 65%. The left ventricle has normal function. The left ventricle has no regional wall motion abnormalities. Left ventricular diastolic parameters were normal.  2. Right ventricular systolic function is normal. The right ventricular size is normal. There is normal pulmonary artery systolic pressure.  3. The mitral valve is normal in structure. No evidence of mitral valve regurgitation.  4. The aortic valve is tricuspid. Aortic valve regurgitation is not visualized. FINDINGS  Left Ventricle: Left ventricular ejection fraction, by estimation, is 60 to 65%. The left ventricle has normal function. The left ventricle has no regional wall motion abnormalities. The left ventricular internal cavity size was normal in size. There is  no left ventricular hypertrophy. Left ventricular diastolic parameters were normal. Right Ventricle: The right ventricular size is normal. No increase in right ventricular wall thickness. Right ventricular systolic function is normal. There is normal pulmonary artery systolic pressure. The tricuspid regurgitant velocity is 2.31 m/s, and  with an assumed right atrial pressure of 10 mmHg, the estimated right ventricular systolic pressure is 31.3 mmHg. Left Atrium: Left atrial size was normal in size. Right Atrium: Right atrial size was normal in size. Pericardium: There is no evidence of pericardial effusion. Mitral Valve: The mitral valve is normal  in structure. No evidence of mitral valve regurgitation. Tricuspid Valve: The tricuspid valve is normal in structure. Tricuspid valve regurgitation is trivial. Aortic Valve: The aortic valve is tricuspid. Aortic valve regurgitation is not visualized. Pulmonic Valve: The pulmonic valve was not well visualized. Pulmonic valve regurgitation is not visualized. Aorta: The aortic root is normal in size and structure. IAS/Shunts: The interatrial septum was not assessed.  LEFT VENTRICLE PLAX 2D LVIDd:         4.60 cm  Diastology LVIDs:         3.12 cm  LV e' lateral: 21.60 cm/s LV PW:  0.77 cm  LV e' medial:  12.70 cm/s LV IVS:        0.63 cm LVOT diam:     1.80 cm LVOT Area:     2.54 cm  RIGHT VENTRICLE             IVC RV S prime:     12.90 cm/s  IVC diam: 1.74 cm TAPSE (M-mode): 2.1 cm LEFT ATRIUM             Index       RIGHT ATRIUM           Index LA diam:        3.10 cm 1.73 cm/m  RA Area:     12.30 cm LA Vol (A2C):   34.7 ml 19.33 ml/m RA Volume:   28.40 ml  15.82 ml/m LA Vol (A4C):   37.8 ml 21.05 ml/m LA Biplane Vol: 37.3 ml 20.77 ml/m   AORTA Ao Root diam: 2.70 cm MITRAL VALVE               TRICUSPID VALVE MV Area (PHT): 2.35 cm    TR Peak grad:   21.3 mmHg MV Decel Time: 323 msec    TR Vmax:        231.00 cm/s MV A velocity: 60.10 cm/s                            SHUNTS                            Systemic Diam: 1.80 cm Harold Hedge MD Electronically signed by Harold Hedge MD Signature Date/Time: 09/29/2019/8:12:57 AM    Final      Labs:   Basic Metabolic Panel: Recent Labs  Lab 09/28/19 0050 09/29/19 0531  NA 140 140  K 4.0 3.6  CL 108 108  CO2 23 23  GLUCOSE 103* 95  BUN 8 7  CREATININE 0.79 0.65  CALCIUM 9.0 8.7*   GFR Estimated Creatinine Clearance: 107.3 mL/min (by C-G formula based on SCr of 0.65 mg/dL). Liver Function Tests: Recent Labs  Lab 09/28/19 0050  AST 23  ALT 20  ALKPHOS 29*  BILITOT 0.9  PROT 6.2*  ALBUMIN 3.7   No results for input(s): LIPASE, AMYLASE  in the last 168 hours. No results for input(s): AMMONIA in the last 168 hours. Coagulation profile Recent Labs  Lab 09/28/19 0842  INR 1.0    CBC: Recent Labs  Lab 09/28/19 0050 09/29/19 0531  WBC 7.6 7.5  HGB 14.0 14.7  HCT 40.0 42.3  MCV 90.9 91.2  PLT 232 234   Cardiac Enzymes: No results for input(s): CKTOTAL, CKMB, CKMBINDEX, TROPONINI in the last 168 hours. BNP: Invalid input(s): POCBNP CBG: No results for input(s): GLUCAP in the last 168 hours. D-Dimer No results for input(s): DDIMER in the last 72 hours. Hgb A1c No results for input(s): HGBA1C in the last 72 hours. Lipid Profile No results for input(s): CHOL, HDL, LDLCALC, TRIG, CHOLHDL, LDLDIRECT in the last 72 hours. Thyroid function studies Recent Labs    09/28/19 1507  TSH 4.393   Anemia work up No results for input(s): VITAMINB12, FOLATE, FERRITIN, TIBC, IRON, RETICCTPCT in the last 72 hours. Microbiology Recent Results (from the past 240 hour(s))  SARS Coronavirus 2 by RT PCR (hospital order, performed in [facility] hospital lab) Nasopharyngeal Nasopharyngeal Swab     Status: None  Collection Time: 09/28/19  2:02 AM   Specimen: Nasopharyngeal Swab  Result Value Ref Range Status   SARS Coronavirus 2 NEGATIVE NEGATIVE Final    Comment: (NOTE) SARS-CoV-2 target nucleic acids are NOT DETECTED.  The SARS-CoV-2 RNA is generally detectable in upper and lower respiratory specimens during the acute phase of infection. The lowest concentration of SARS-CoV-2 viral copies this assay can detect is 250 copies / mL. A negative result does not preclude SARS-CoV-2 infection and should not be used as the sole basis for treatment or other patient management decisions.  A negative result may occur with improper specimen collection / handling, submission of specimen other than nasopharyngeal swab, presence of viral mutation(s) within the areas targeted by this assay, and inadequate number of viral copies (<250  copies / mL). A negative result must be combined with clinical observations, patient history, and epidemiological information.  Fact Sheet for Patients:   BoilerBrush.com.cy  Fact Sheet for Healthcare Providers: https://pope.com/  This test is not yet approved or  cleared by the [place] FDA and has been authorized for detection and/or diagnosis of SARS-CoV-2 by FDA under an Emergency Use Authorization (EUA).  This EUA will remain in effect (meaning this test can be used) for the duration of the COVID-19 declaration under Section 564(b)(1) of the Act, 21 U.S.C. section 360bbb-3(b)(1), unless the authorization is terminated or revoked sooner.  Performed at [facility], [address], [place], [place] [address]      Discharge Instructions:   Discharge Instructions    Diet general   Complete by: As directed    Increase activity slowly   Complete by: As directed      Allergies as of 09/29/2019   No Known Allergies     Medication List    TAKE these medications   desogestrel-ethinyl estradiol 0.15-30 MG-MCG tablet Commonly known as: APRI Take 1 tablet by mouth as directed.   ibuprofen 400 MG tablet Commonly known as: ADVIL Take 1 tablet (400 mg total) by mouth every 6 (six) hours as needed for moderate pain.       Follow-up Information    Dalia Heading, MD Follow up in 1 week(s).   Specialty: Cardiology Contact information: [address] [place] [address] [phone]                Time coordinating discharge: 29 minutes  Signed:  Lurene Shadow  [facility] 09/29/2019, 11:45 AM

## 2019-09-29 NOTE — Progress Notes (Signed)
Discharge instructions explained to pt/ verbalized an understanding/ iv and tele removed/ will transport off unit via wheelchair.  

## 2019-10-02 LAB — ANTIPHOSPHOLIPID SYNDROME EVAL, BLD
Anticardiolipin IgA: <9 APL U/mL (ref 0–11)
Anticardiolipin IgG: <9 GPL U/mL (ref 0–14)
Anticardiolipin IgM: 13 MPL U/mL — ABNORMAL HIGH (ref 0–12)
DRVVT: 27.9 s (ref 0.0–47.0)
PTT Lupus Anticoagulant: 28.8 s (ref 0.0–51.9)
Phosphatydalserine, IgA: 1 APS IgA (ref 0–20)
Phosphatydalserine, IgG: 1 GPS IgG (ref 0–11)
Phosphatydalserine, IgM: 11 MPS IgM (ref 0–25)

## 2019-10-19 ENCOUNTER — Ambulatory Visit: Admit: 2019-10-19 | Disposition: A | Discharge status: Home / Self Care | Payer: 59

## 2019-10-19 ENCOUNTER — Telehealth: Payer: Self-pay

## 2019-10-19 NOTE — Telephone Encounter (Signed)
Patient presented to [facility] c/o sudden onset RUQ abdominal pain. Patient was recently admitted and discharged to [facility] for myocarditis. Discussed with Wendee Beavers, NP. Redirected patient to [facility] ED; patient requires higher level of service than [facility] can provide.

## 2019-10-27 ENCOUNTER — Other Ambulatory Visit: Payer: Self-pay

## 2019-10-27 ENCOUNTER — Emergency Department: Payer: 59

## 2019-10-27 ENCOUNTER — Emergency Department
Admission: EM | Admit: 2019-10-27 | Discharge: 2019-10-27 | Disposition: A | Discharge status: Home / Self Care | Payer: 59 | Attending: Emergency Medicine | Admitting: Emergency Medicine

## 2019-10-27 ENCOUNTER — Encounter: Payer: Self-pay | Admitting: Emergency Medicine

## 2019-10-27 DIAGNOSIS — R0789 Other chest pain: Secondary | ICD-10-CM | POA: Diagnosis not present

## 2019-10-27 DIAGNOSIS — R079 Chest pain, unspecified: Secondary | ICD-10-CM | POA: Diagnosis present

## 2019-10-27 LAB — BASIC METABOLIC PANEL
Anion gap: 11 (ref 5–15)
BUN: 12 mg/dL (ref 6–20)
CO2: 21 mmol/L — ABNORMAL LOW (ref 22–32)
Calcium: 9.1 mg/dL (ref 8.9–10.3)
Chloride: 108 mmol/L (ref 98–111)
Creatinine, Ser: 0.68 mg/dL (ref 0.44–1.00)
GFR calc Af Amer: >60 mL/min (ref >60)
GFR calc non Af Amer: >60 mL/min (ref >60)
Glucose, Bld: 117 mg/dL — ABNORMAL HIGH (ref 70–99)
Potassium: 3.4 mmol/L — ABNORMAL LOW (ref 3.5–5.1)
Sodium: 140 mmol/L (ref 135–145)

## 2019-10-27 LAB — CBC
HCT: 39.5 % (ref 36.0–46.0)
Hemoglobin: 14.5 g/dL (ref 12.0–15.0)
MCH: 32.5 pg (ref 26.0–34.0)
MCHC: 36.7 g/dL — ABNORMAL HIGH (ref 30.0–36.0)
MCV: 88.6 fL (ref 80.0–100.0)
Platelets: 239 10*3/uL (ref 150–400)
RBC: 4.46 MIL/uL (ref 3.87–5.11)
RDW: 12.4 % (ref 11.5–15.5)
WBC: 6.5 10*3/uL (ref 4.0–10.5)
nRBC: 0 % (ref 0.0–0.2)

## 2019-10-27 LAB — POCT PREGNANCY, URINE: Preg Test, Ur: NEGATIVE

## 2019-10-27 LAB — TROPONIN I (HIGH SENSITIVITY): Troponin I (High Sensitivity): <2 ng/L (ref <18)

## 2019-10-27 NOTE — ED Provider Notes (Signed)
[facility] Emergency Department Provider Note   ____________________________________________    I have reviewed the triage vital signs and the nursing notes.   HISTORY  Chief Complaint Chest Pain     HPI Carol Moody is a 24 y.o. female who presents with complaints of chest discomfort.  Patient notes she has had chest pain since diagnosis of myocarditis in July, it seems to be intermittent but typically at night.  Has been working with [facility] cardiology Dr. Lady Gary.  Symptoms seemed worse last night so presented to the emergency department after discussion with Dr. Lady Gary.  Currently is feeling somewhat better.  No shortness of breath, no pleurisy.  No fevers chills.  Past Medical History:  Diagnosis Date   Myocarditis [facility])     Patient Active Problem List   Diagnosis Date Noted   Myocarditis (HCC) 09/28/2019    Past Surgical History:  Procedure Laterality Date   KNEE ARTHROSCOPY WITH ANTERIOR CRUCIATE LIGAMENT (ACL) REPAIR      Prior to Admission medications   Medication Sig Start Date End Date Taking? Authorizing Provider  desogestrel-ethinyl estradiol (APRI) 0.15-30 MG-MCG tablet Take 1 tablet by mouth as directed.    [provider]  ibuprofen (ADVIL) 400 MG tablet Take 1 tablet (400 mg total) by mouth every 6 (six) hours as needed for moderate pain. 09/29/19   Lurene Shadow, MD     Allergies Patient has no known allergies.  Family History  Problem Relation Age of Onset   Hypertension Mother    Hypertension Father     Social History Social History   Tobacco Use   Smoking status: Never Smoker  Vaping Use   Vaping Use: Never used  Substance Use Topics   Alcohol use: Yes    Alcohol/week: 2.0 standard drinks    Types: 2 Glasses of wine per week   Drug use: Never    Review of Systems  Constitutional: No fever/chills Eyes: No visual changes.  ENT: No neck pain Cardiovascular: As above Respiratory: Denies  shortness of breath. Gastrointestinal: No abdominal pain.    Genitourinary: No GU complaints Musculoskeletal: No musculoskeletal complaints Skin: Negative for rash. Neurological: No neuro complaints   ____________________________________________   PHYSICAL EXAM:  VITAL SIGNS: ED Triage Vitals  Enc Vitals Group     BP 10/27/19 0203 129/78     Pulse Rate 10/27/19 0203 65     Resp 10/27/19 0203 20     Temp 10/27/19 0203 98.9 F (37.2 C)     Temp Source 10/27/19 0203 Oral     SpO2 10/27/19 0203 100 %     Weight 10/27/19 0158 68 kg (150 lb)     Height 10/27/19 0158 1.702 m (5\' 7" )     Head Circumference --      Peak Flow --      Pain Score 10/27/19 0158 4     Pain Loc --      Pain Edu? --      Excl. in GC? --     Constitutional: Alert and oriented.   Nose: No congestion/rhinnorhea. Mouth/Throat: Mucous membranes are moist.    Cardiovascular: 10/29/19 peripheral circulation. Respiratory: Normal respiratory effort.  No retractions.  Gastrointestinal: Soft and nontender. No distention.  .  Musculoskeletal: No lower extremity tenderness nor edema.  Warm and well perfused Neurologic:  Normal speech and language. No gross focal neurologic deficits are appreciated.  Skin:  Skin is warm, dry and intact. No rash noted. Psychiatric: Mood  and affect are normal. Speech and behavior are normal.  ____________________________________________   LABS (all labs ordered are listed, but only abnormal results are displayed)  Labs Reviewed  BASIC METABOLIC PANEL - Abnormal; Notable for the following components:      Result Value   Potassium 3.4 (*)    CO2 21 (*)    Glucose, Bld 117 (*)    All other components within normal limits  CBC - Abnormal; Notable for the following components:   MCHC 36.7 (*)    All other components within normal limits  POCT PREGNANCY, URINE  POC URINE PREG, ED  TROPONIN I (HIGH SENSITIVITY)  TROPONIN I (HIGH SENSITIVITY)    ____________________________________________  EKG  ED ECG REPORT I, Jene Every, the attending physician, personally viewed and interpreted this ECG.  Date: 10/27/2019  Rhythm: normal sinus rhythm QRS Axis: normal Intervals: normal ST/T Wave abnormalities: normal Narrative Interpretation: no evidence of acute ischemia  ____________________________________________  RADIOLOGY  Chest x-ray reviewed by me, no infiltrate effusion or pneumothorax ____________________________________________   PROCEDURES  Procedure(s) performed: No  Procedures   Critical Care performed: No ____________________________________________   INITIAL IMPRESSION / ASSESSMENT AND PLAN / ED COURSE  Pertinent labs & imaging results that were available during my care of the patient were reviewed by me and considered in my medical decision making (see chart for details).  Patient presents with complaints of chest discomfort.  Review of medical records demonstrates patient was diagnosed with myocarditis with elevated troponin in July.  Differential includes myocarditis, pericarditis, not consistent with ACS dissection or PE  High sensitive troponin is undetectable.  White blood cell count is normal.  Chemistries are normal.  EKG is very reassuring  Discussed with Dr. Lady Gary of cardiology who recommends discharge with follow-up in his office this week    ____________________________________________   FINAL CLINICAL IMPRESSION(S) / ED DIAGNOSES  Final diagnoses:  Atypical chest pain        Note:  This document was prepared using Dragon voice recognition software and may include unintentional dictation errors.   Jene Every, MD 10/27/19 [phone]

## 2019-10-27 NOTE — ED Notes (Signed)
ED Provider at bedside. 

## 2019-10-31 DIAGNOSIS — R079 Chest pain, unspecified: Secondary | ICD-10-CM | POA: Insufficient documentation

## 2019-10-31 DIAGNOSIS — F411 Generalized anxiety disorder: Secondary | ICD-10-CM | POA: Insufficient documentation

## 2019-10-31 HISTORY — DX: Chest pain, unspecified: R07.9

## 2019-11-09 ENCOUNTER — Emergency Department
Admission: EM | Admit: 2019-11-09 | Discharge: 2019-11-09 | Disposition: A | Discharge status: Home / Self Care | Payer: 59 | Attending: Emergency Medicine | Admitting: Emergency Medicine

## 2019-11-09 ENCOUNTER — Other Ambulatory Visit: Payer: Self-pay

## 2019-11-09 ENCOUNTER — Emergency Department: Payer: 59

## 2019-11-09 DIAGNOSIS — R0602 Shortness of breath: Secondary | ICD-10-CM | POA: Diagnosis not present

## 2019-11-09 DIAGNOSIS — R0789 Other chest pain: Secondary | ICD-10-CM | POA: Insufficient documentation

## 2019-11-09 DIAGNOSIS — Z79899 Other long term (current) drug therapy: Secondary | ICD-10-CM | POA: Diagnosis not present

## 2019-11-09 LAB — BASIC METABOLIC PANEL
Anion gap: 7 (ref 5–15)
BUN: 9 mg/dL (ref 6–20)
CO2: 25 mmol/L (ref 22–32)
Calcium: 9.1 mg/dL (ref 8.9–10.3)
Chloride: 108 mmol/L (ref 98–111)
Creatinine, Ser: 0.8 mg/dL (ref 0.44–1.00)
GFR calc Af Amer: >60 mL/min (ref >60)
GFR calc non Af Amer: >60 mL/min (ref >60)
Glucose, Bld: 92 mg/dL (ref 70–99)
Potassium: 3.8 mmol/L (ref 3.5–5.1)
Sodium: 140 mmol/L (ref 135–145)

## 2019-11-09 LAB — FIBRIN DERIVATIVES D-DIMER (ARMC ONLY): Fibrin derivatives D-dimer (ARMC): 260.58 ng/mL (FEU) (ref 0.00–499.00)

## 2019-11-09 LAB — TROPONIN I (HIGH SENSITIVITY): Troponin I (High Sensitivity): <2 ng/L (ref <18)

## 2019-11-09 LAB — CBC
HCT: 40.1 % (ref 36.0–46.0)
Hemoglobin: 14.6 g/dL (ref 12.0–15.0)
MCH: 32.2 pg (ref 26.0–34.0)
MCHC: 36.4 g/dL — ABNORMAL HIGH (ref 30.0–36.0)
MCV: 88.5 fL (ref 80.0–100.0)
Platelets: 253 10*3/uL (ref 150–400)
RBC: 4.53 MIL/uL (ref 3.87–5.11)
RDW: 12.1 % (ref 11.5–15.5)
WBC: 7.3 10*3/uL (ref 4.0–10.5)
nRBC: 0 % (ref 0.0–0.2)

## 2019-11-09 NOTE — ED Provider Notes (Signed)
[facility] Emergency Department Provider Note   ____________________________________________   First MD Initiated Contact with Patient 11/09/19 [phone]     (approximate)  I have reviewed the triage vital signs and the nursing notes.   HISTORY  Chief Complaint Chest Pain    HPI Carol Moody is a 24 y.o. female with past medical history of myocarditis who presents to the ED complaining of chest pain.  Patient was admitted for myocarditis last month and states that since then she has been having intermittent sharp pain in her chest.  The pain is not exacerbated or alleviated by anything, she had an episode of worsening pain last night prompting her visit to the ED.  She denies any fevers or cough, but has been feeling short of breath at times.  She has not noticed any pain or swelling in her legs, denies any history of DVT/PE but does currently take OCPs.  Her chest pain is much improved from last night.        Past Medical History:  Diagnosis Date  . Myocarditis [facility])     Patient Active Problem List   Diagnosis Date Noted  . 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 KNEE ARTHROSCOPY WITH ANTERIOR CRUCIATE LIGAMENT (ACL) REPAIR      Prior to Admission medications   Medication Sig Start Date End Date Taking? Authorizing Provider  colchicine 0.6 MG tablet Take 0.6 mg by mouth daily. States she takes only taking 1/2 tab at present   Yes [provider]  omeprazole (PRILOSEC) 20 MG capsule Take 20 mg by mouth daily.   Yes [provider]  sertraline (ZOLOFT) 25 MG tablet Take 25 mg by mouth daily.   Yes [provider]  desogestrel-ethinyl estradiol (APRI) 0.15-30 MG-MCG tablet Take 1 tablet by mouth as directed.    [provider]  ibuprofen (ADVIL) 400 MG tablet Take 1 tablet (400 mg total) by mouth every 6 (six) hours as needed for moderate pain. 09/29/19   Lurene Shadow,  MD    Allergies Patient has no known allergies.  Family History  Problem Relation Age of Onset  . Hypertension Mother   . Hypertension Father     Social History Social History   Tobacco Use  . Smoking status: Never Smoker  Vaping Use  . Vaping Use: Never used  Substance Use Topics  . Alcohol use: Yes    Alcohol/week: 2.0 standard drinks    Types: 2 Glasses of wine per week  . Drug use: Never    Review of Systems  Constitutional: No fever/chills Eyes: No visual changes. ENT: No sore throat. Cardiovascular: Positive for chest pain. Respiratory: Positive for shortness of breath. Gastrointestinal: No abdominal pain.  No nausea, no vomiting.  No diarrhea.  No constipation. Genitourinary: Negative for dysuria. Musculoskeletal: Negative for back pain. Skin: Negative for rash. Neurological: Negative for headaches, focal weakness or numbness.  ____________________________________________   PHYSICAL EXAM:  VITAL SIGNS: ED Triage Vitals  Enc Vitals Group     BP 11/09/19 0151 134/84     Pulse Rate 11/09/19 0151 66     Resp 11/09/19 0151 18     Temp 11/09/19 0151 98.3 F (36.8 C)     Temp Source 11/09/19 0527 Oral     SpO2 11/09/19 0151 100 %     Weight 11/09/19 0149 150 lb (68 kg)     Height 11/09/19 0149 5\' 7"  (1.702 m)  Head Circumference --      Peak Flow --      Pain Score 11/09/19 0148 5     Pain Loc --      Pain Edu? --      Excl. in GC? --     Constitutional: Alert and oriented. Eyes: Conjunctivae are normal. Head: Atraumatic. Nose: No congestion/rhinnorhea. Mouth/Throat: Mucous membranes are moist. Neck: Normal ROM Cardiovascular: Normal rate, regular rhythm. Grossly normal heart sounds. Respiratory: Normal respiratory effort.  No retractions. Lungs CTAB.  No chest wall tenderness to palpation. Gastrointestinal: Soft and nontender. No distention. Genitourinary: deferred Musculoskeletal: No lower extremity tenderness nor edema. Neurologic:   Normal speech and language. No gross focal neurologic deficits are appreciated. Skin:  Skin is warm, dry and intact. No rash noted. Psychiatric: Mood and affect are normal. Speech and behavior are normal.  ____________________________________________   LABS (all labs ordered are listed, but only abnormal results are displayed)  Labs Reviewed  CBC - Abnormal; Notable for the following components:      Result Value   MCHC 36.4 (*)    All other components within normal limits  BASIC METABOLIC PANEL  FIBRIN DERIVATIVES D-DIMER ([facility] ONLY)  POC URINE PREG, ED  TROPONIN I (HIGH SENSITIVITY)   ____________________________________________  EKG  ED ECG REPORT I, Chesley Noon, the attending physician, personally viewed and interpreted this ECG.   Date: 11/09/2019  EKG Time: 01:44  Rate: 71  Rhythm: Sinus arrhythmia  Axis: Normal  Intervals:none  ST&T Change: None   PROCEDURES  Procedure(s) performed (including Critical Care):  Procedures   ____________________________________________   INITIAL IMPRESSION / ASSESSMENT AND PLAN / ED COURSE       23 year old female with past medical history of myocarditis who presents to the ED complaining of intermittent sharp chest pain over the past couple of days associated with some mild shortness of breath over the past week and a half.  She reports minimal chest pain at this time and is not in any respiratory distress, maintaining O2 sats on room air.  EKG is unremarkable and troponin within normal limits, any evidence of myocarditis seems to have resolved.  Her chest x-ray is also unremarkable.  However, given her sharp chest pain along with shortness of breath, we will need to further assess for PE.  Unable to rule out via [facility] criteria given patient is on OCPs.  We will screen D-dimer as patient is low risk by [place], if this is negative I doubt PE.  D-dimer within normal limits and patient symptoms remain improved.  She is  appropriate for discharge home with outpatient follow-up, was counseled to return to the ED for new worsening symptoms.  Patient agrees with plan.      ____________________________________________   FINAL CLINICAL IMPRESSION(S) / ED DIAGNOSES  Final diagnoses:  Atypical chest pain     ED Discharge Orders    None       Note:  This document was prepared using Dragon voice recognition software and may include unintentional dictation errors.   Chesley Noon, MD 11/09/19 1059

## 2019-11-09 NOTE — ED Notes (Signed)
Called & spoke to lab regarding missing results; st no blood received; ED tech personally walked blood tubes to lab and gave to ED tech; lab will search again & call back

## 2019-11-09 NOTE — ED Notes (Signed)
Pt placed in triage 3 for dr Larinda Buttery to evaluate

## 2019-11-09 NOTE — ED Notes (Signed)
Pt called and asked about coming back inside to be evaluated

## 2019-11-09 NOTE — ED Triage Notes (Signed)
Pt to ed via pov from home c/o CP. PT has CP radiating into arm. PT has hx of same except it is worse this time, dx with myocarditis. PT also has shob x1 week, was told by pulmonology that it  Was d/t myocarditis.

## 2019-11-09 NOTE — ED Notes (Signed)
Lab reports initial blood tubes sent were not found

## 2019-11-18 ENCOUNTER — Emergency Department
Admission: EM | Admit: 2019-11-18 | Discharge: 2019-11-18 | Disposition: A | Discharge status: Home / Self Care | Payer: 59 | Attending: Emergency Medicine | Admitting: Emergency Medicine

## 2019-11-18 ENCOUNTER — Emergency Department: Payer: 59

## 2019-11-18 ENCOUNTER — Other Ambulatory Visit: Payer: Self-pay

## 2019-11-18 DIAGNOSIS — M79605 Pain in left leg: Secondary | ICD-10-CM

## 2019-11-18 NOTE — ED Provider Notes (Signed)
[facility] Emergency Department Provider Note ____________________________________________  Time seen: 2121  I have reviewed the triage vital signs and the nursing notes.  HISTORY  Chief Complaint  Leg Pain  HPI Carol Moody is a 24 y.o. female presents her self to the ED for evaluation of intermittent leg pain described as Happy to the calf.  Patient describes  intermittent symptoms over the last 2 to 3 days patient denies any increased activity or trauma.  She denies any distal paresthesias, leg edema, or skin temp/color changes.  Patient also has a secondary concern for some visible superficial veins to the upper left thigh.  She denies again any pain or discomfort related to the area.  She reports she has just "never seen them before."  Past Medical History:  Diagnosis Date  . Myocarditis [facility])     Patient Active Problem List   Diagnosis Date Noted  . 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 KNEE ARTHROSCOPY WITH ANTERIOR CRUCIATE LIGAMENT (ACL) REPAIR      Prior to Admission medications   Medication Sig Start Date End Date Taking? Authorizing Provider  colchicine 0.6 MG tablet Take 0.6 mg by mouth daily. States she takes only taking 1/2 tab at present    [provider]  desogestrel-ethinyl estradiol (APRI) 0.15-30 MG-MCG tablet Take 1 tablet by mouth as directed.    [provider]  ibuprofen (ADVIL) 400 MG tablet Take 1 tablet (400 mg total) by mouth every 6 (six) hours as needed for moderate pain. 09/29/19   Lurene Shadow, MD  omeprazole (PRILOSEC) 20 MG capsule Take 20 mg by mouth daily.    [provider]  sertraline (ZOLOFT) 25 MG tablet Take 25 mg by mouth daily.    [provider]    Allergies Patient has no known allergies.  Family History  Problem Relation Age of Onset  . Hypertension Mother   . Hypertension Father     Social History Social History    Tobacco Use  . Smoking status: Never Smoker  . Smokeless tobacco: Never Used  Vaping Use  . Vaping Use: Never used  Substance Use Topics  . Alcohol use: Yes    Alcohol/week: 2.0 standard drinks    Types: 2 Glasses of wine per week  . Drug use: Never    Review of Systems  Constitutional: Negative for fever. Cardiovascular: Negative for chest pain. Respiratory: Negative for shortness of breath. Musculoskeletal: Negative for back pain. LLE pain Skin: Negative for rash. Neurological: Negative for headaches, focal weakness or numbness. ____________________________________________  PHYSICAL EXAM:  VITAL SIGNS: ED Triage Vitals  Enc Vitals Group     BP 11/18/19 2008 (!) 145/88     Pulse Rate 11/18/19 2008 69     Resp 11/18/19 2008 17     Temp 11/18/19 2008 97.8 F (36.6 C)     Temp src --      SpO2 11/18/19 2008 98 %     Weight 11/18/19 2009 149 lb 14.6 oz (68 kg)     Height 11/18/19 2009 5\' 7"  (1.702 m)     Head Circumference --      Peak Flow --      Pain Score --      Pain Loc --      Pain Edu? --      Excl. in GC? --     Constitutional: Alert and oriented. Well appearing and in no distress. Head: Normocephalic  and atraumatic. Eyes: Conjunctivae are normal. Normal extraocular movements Cardiovascular: Normal rate, regular rhythm. Normal distal pulses.  No CCE distally. Respiratory: Normal respiratory effort. No wheezes/rales/rhonchi. Musculoskeletal: Nontender with normal range of motion in all extremities.  Neurologic: Gross sensation.  Cranial nerves II through XII grossly intact.  Normal gait without ataxia. Normal speech and language. No gross focal neurologic deficits are appreciated. Skin:  Skin is warm, dry and intact. No rash noted.  Patient with no abrasions, lesions, or excoriations.  No torturous varicose veins or palpable superficial phlebitis noted. Psychiatric: Mood and affect are normal. Patient exhibits appropriate insight and  judgment. ____________________________________________   RADIOLOGY  US Venous LLE   IMPRESSION: No evidence of deep venous thrombosis. ____________________________________________  PROCEDURES  Procedures ____________________________________________  INITIAL IMPRESSION / ASSESSMENT AND PLAN / ED COURSE  Patient with ED evaluation of intermittent left leg pain without preceding injury.  Patient's exam is overall benign reassuring at this time.  She has some visible superficial blood vessels to the bilateral upper thighs without signs of phlebitis or thrombophlebitis superficially.  No DVT findings on the LLE ultrasound.  Patient is overall reassured at this time of a normal exam.  She will follow-up with her primary provider return to the ED as needed.  Carol Moody was evaluated in Emergency Department on 11/18/2019 for the symptoms described in the history of present illness. She was evaluated in the context of the global COVID-19 pandemic, which necessitated consideration that the patient might be at risk for infection with the SARS-CoV-2 virus that causes COVID-19. Institutional protocols and algorithms that pertain to the evaluation of patients at risk for COVID-19 are in a state of rapid change based on information released by regulatory bodies including the CDC and federal and state organizations. These policies and algorithms were followed during the patient's care in the ED. ____________________________________________  FINAL CLINICAL IMPRESSION(S) / ED DIAGNOSES  Final diagnoses:  Left leg pain      Tova Vater, Charlesetta Ivory, PA-C 11/18/19 2205    Shaune Pollack, MD 11/23/19 [phone]

## 2019-11-18 NOTE — Discharge Instructions (Addendum)
Your exam and [place] are normal at this time. There is no evidence of a blood clot or superficial phlebitis on the legs. Follow-up with your provider a scheduled.

## 2019-11-18 NOTE — ED Triage Notes (Signed)
Patient c/o left leg pain. Patient denies injury. Patient reports she "can see veins in her leg she's never seen before".

## 2019-12-14 DIAGNOSIS — I493 Ventricular premature depolarization: Secondary | ICD-10-CM | POA: Insufficient documentation

## 2019-12-14 DIAGNOSIS — I313 Pericardial effusion (noninflammatory): Secondary | ICD-10-CM

## 2019-12-14 DIAGNOSIS — I3139 Other pericardial effusion (noninflammatory): Secondary | ICD-10-CM | POA: Insufficient documentation

## 2019-12-14 HISTORY — DX: Pericardial effusion (noninflammatory): I31.3

## 2019-12-14 HISTORY — DX: Other pericardial effusion (noninflammatory): I31.39

## 2020-01-09 ENCOUNTER — Other Ambulatory Visit: Payer: Self-pay

## 2020-01-11 ENCOUNTER — Other Ambulatory Visit: Payer: Self-pay

## 2020-01-11 ENCOUNTER — Ambulatory Visit (INDEPENDENT_AMBULATORY_CARE_PROVIDER_SITE_OTHER): Payer: 59 | Admitting: Gastroenterology

## 2020-01-11 ENCOUNTER — Encounter: Payer: Self-pay | Admitting: Gastroenterology

## 2020-01-11 VITALS — BP 122/85 | HR 71 | Temp 97.5°F | Ht 67.0 in | Wt 149.2 lb

## 2020-01-11 DIAGNOSIS — G8929 Other chronic pain: Secondary | ICD-10-CM | POA: Insufficient documentation

## 2020-01-11 DIAGNOSIS — R0789 Other chest pain: Secondary | ICD-10-CM

## 2020-01-11 DIAGNOSIS — R1013 Epigastric pain: Secondary | ICD-10-CM

## 2020-01-11 HISTORY — DX: Other chest pain: R07.89

## 2020-01-11 HISTORY — DX: Epigastric pain: R10.13

## 2020-01-11 HISTORY — DX: Other chronic pain: G89.29

## 2020-01-11 NOTE — Progress Notes (Signed)
Cephas Darby, MD [address]  [place]  [place], [place] [address]  Main: [phone]  Fax: [phone]    Gastroenterology Consultation  Referring Provider:     Gladstone Lighter, MD Primary Care Physician:  Gladstone Lighter, MD Primary Gastroenterologist:  Dr. Cephas Darby Reason for Consultation:     Atypical chest pain, epigastric discomfort        HPI:   Carol Moody is a 24 y.o. female referred by Dr. Gladstone Lighter, MD  for consultation & management of atypical chest pain.  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  She is closely followed by Dr. Ubaldo Glassing this time.  She underwent cardiac MRI with no evidence of myocarditis.  She also underwent Holter monitoring which was negative.  Treadmill test revealed PVCs and started on metoprolol.  She was briefly on call discern for myocarditis and that was discontinued due to GI upset.  She went to ER in early September secondary to left-sided chest pain, cardiac etiology was ruled out.  She has been taking omeprazole 20 mg daily which did provide some relief.  Patient has history of anxiety but she reports that it is under control.  She was more anxious when she was diagnosed with myocarditis and she was on Zoloft at that time.  Currently, she is not taking Zoloft.  She is currently working in 2 jobs, graduated from college last year.  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 She also reports mild epigastric discomfort.  She does experience mild intermittent left lower abdominal pain.  She reports having regular bowel movements, denies any rectal bleeding Labs are unremarkable, ESR CRP normal, HIV negative She does have mild B12 deficiency for which she is taking oral B12 supplements daily  NSAIDs: None  Antiplts/Anticoagulants/Anti thrombotics: None  GI Procedures: None She denies family history of IBD, GI malignancy  Past Medical History:  Diagnosis Date  .  Myocarditis [facility])     Past Surgical History:  Procedure Laterality Date  . KNEE ARTHROSCOPY WITH ANTERIOR CRUCIATE LIGAMENT (ACL) REPAIR      Current Outpatient Medications:  .  Cholecalciferol 25 MCG (1000 UT) tablet, Take by mouth., Disp: , Rfl:  .  cyanocobalamin 1000 MCG tablet, Take by mouth., Disp: , Rfl:  .  desogestrel-ethinyl estradiol (APRI) 0.15-30 MG-MCG tablet, Take 1 tablet by mouth as directed., Disp: , Rfl:  .  omeprazole (PRILOSEC) 20 MG capsule, Take 20 mg by mouth daily., Disp: , Rfl:  .  ibuprofen (ADVIL) 400 MG tablet, Take 1 tablet (400 mg total) by mouth every 6 (six) hours as needed for moderate pain. (Patient not taking: Reported on 01/11/2020), Disp: , Rfl:    Family History  Problem Relation Age of Onset  . Hypertension Mother   . Hypertension Father      Social History   Tobacco Use  . Smoking status: Never Smoker  . Smokeless tobacco: Never Used  Vaping Use  . Vaping Use: Never used  Substance Use Topics  . Alcohol use: Yes    Alcohol/week: 2.0 standard drinks    Types: 2 Glasses of wine per week  . Drug use: Never    Allergies as of 01/11/2020  . (No Known Allergies)    Review of Systems:    All systems reviewed and negative except where noted in HPI.   Physical Exam:  BP 122/85 (BP Location: Left Arm, Patient Position: Sitting, Cuff Size: Normal)   Pulse  71   Temp (!) 97.5 F (36.4 C) (Oral)   Ht 5' 7" (1.702 m)   Wt 149 lb 4 oz (67.7 kg)   BMI 23.38 kg/m  No LMP recorded.  General:   Alert,  Well-developed, well-nourished, pleasant and cooperative in NAD Head:  Normocephalic and atraumatic. Eyes:  Sclera clear, no icterus.   Conjunctiva pink. Ears:  Normal auditory acuity. Nose:  No deformity, discharge, or lesions. Mouth:  No deformity or lesions,oropharynx pink & moist. Neck:  Supple; no masses or thyromegaly. Lungs:  Respirations even and unlabored.  Clear throughout to auscultation.   No wheezes, crackles, or rhonchi. No  acute distress. Heart:  Regular rate and rhythm; no murmurs, clicks, rubs, or gallops. Abdomen:  Normal bowel sounds. Soft, mild epigastric tenderness and non-distended without masses, hepatosplenomegaly or hernias noted.  No guarding or rebound tenderness.   Rectal: Not performed Msk:  Symmetrical without gross deformities. Good, equal movement & strength bilaterally. Pulses:  Normal pulses noted. Extremities:  No clubbing or edema.  No cyanosis. Neurologic:  Alert and oriented x3;  grossly normal neurologically. Skin:  Intact without significant lesions or rashes. No jaundice. Lymph Nodes:  No significant cervical adenopathy. Psych:  Alert and cooperative. Normal mood and affect.  Imaging Studies: No abdominal imaging  Assessment and Plan:   Carol Moody is a 24 y.o. female with history of anxiety, recovered from viral myocarditis in 10/2019, currently on metoprolol is seen in consultation for atypical chest pain.  Cardiac etiology has been ruled out.  Atypical chest pain:  Partially relieved on omeprazole 20 mg daily, advised to increase to twice daily If symptoms are persistent, can perform upper endoscopy  Epigastric pain Recommend to check H. pylori IgG and H. pylori breath test as patient is on PPI   Follow up based on above work-up   Cephas Darby, MD

## 2020-01-20 ENCOUNTER — Other Ambulatory Visit: Payer: Self-pay

## 2020-01-20 ENCOUNTER — Encounter: Payer: Self-pay | Admitting: Emergency Medicine

## 2020-01-20 ENCOUNTER — Emergency Department: Payer: 59

## 2020-01-20 ENCOUNTER — Emergency Department
Admission: EM | Admit: 2020-01-20 | Discharge: 2020-01-20 | Disposition: A | Discharge status: Home / Self Care | Payer: 59 | Attending: Emergency Medicine | Admitting: Emergency Medicine

## 2020-01-20 DIAGNOSIS — Z20822 Contact with and (suspected) exposure to covid-19: Secondary | ICD-10-CM | POA: Diagnosis not present

## 2020-01-20 DIAGNOSIS — R079 Chest pain, unspecified: Secondary | ICD-10-CM | POA: Insufficient documentation

## 2020-01-20 DIAGNOSIS — R059 Cough, unspecified: Secondary | ICD-10-CM | POA: Insufficient documentation

## 2020-01-20 DIAGNOSIS — R0602 Shortness of breath: Secondary | ICD-10-CM | POA: Insufficient documentation

## 2020-01-20 DIAGNOSIS — M549 Dorsalgia, unspecified: Secondary | ICD-10-CM | POA: Diagnosis not present

## 2020-01-20 LAB — CBC
HCT: 40.7 % (ref 36.0–46.0)
Hemoglobin: 14.2 g/dL (ref 12.0–15.0)
MCH: 31.9 pg (ref 26.0–34.0)
MCHC: 34.9 g/dL (ref 30.0–36.0)
MCV: 91.5 fL (ref 80.0–100.0)
Platelets: 244 10*3/uL (ref 150–400)
RBC: 4.45 MIL/uL (ref 3.87–5.11)
RDW: 11.9 % (ref 11.5–15.5)
WBC: 7.8 10*3/uL (ref 4.0–10.5)
nRBC: 0 % (ref 0.0–0.2)

## 2020-01-20 LAB — BASIC METABOLIC PANEL
Anion gap: 6 (ref 5–15)
BUN: 12 mg/dL (ref 6–20)
CO2: 25 mmol/L (ref 22–32)
Calcium: 8.4 mg/dL — ABNORMAL LOW (ref 8.9–10.3)
Chloride: 104 mmol/L (ref 98–111)
Creatinine, Ser: 0.71 mg/dL (ref 0.44–1.00)
GFR, Estimated: >60 mL/min (ref >60)
Glucose, Bld: 87 mg/dL (ref 70–99)
Potassium: 3.8 mmol/L (ref 3.5–5.1)
Sodium: 135 mmol/L (ref 135–145)

## 2020-01-20 LAB — RESPIRATORY PANEL BY RT PCR (FLU A&B, COVID)
Influenza A by PCR: NEGATIVE
Influenza B by PCR: NEGATIVE
SARS Coronavirus 2 by RT PCR: NEGATIVE

## 2020-01-20 LAB — TROPONIN I (HIGH SENSITIVITY)
Troponin I (High Sensitivity): <2 ng/L (ref <18)
Troponin I (High Sensitivity): <2 ng/L (ref <18)

## 2020-01-20 NOTE — ED Provider Notes (Signed)
[facility] Emergency Department Provider Note   ____________________________________________   First MD Initiated Contact with Patient 01/20/20 0408     (approximate)  I have reviewed the triage vital signs and the nursing notes.   HISTORY  Chief Complaint Chest Pain    HPI [name] is a 24 y.o. female with past medical history of myocarditis who presents to the ED complaining of chest pain.  Patient reports that she was diagnosed with myocarditis approximately 3 months ago and has been dealing with intermittent pain in her chest ever since then.  Pain has seemed to flareup over the past week and she describes it as a sharp pain that can radiate into her back.  Pain is present intermittently but not exacerbated or alleviated by anything in particular.  She has had a dry cough and endorses mild shortness of breath, but denies any fevers, nausea, or vomiting.  She states symptoms are similar to when she was diagnosed with myocarditis.  She spoke with her cardiologist earlier this week, who recommended she start a course of ibuprofen.  Symptoms had improved after taking ibuprofen, but seem to get worse again this evening.        Past Medical History:  Diagnosis Date  . Chest pain at rest 10/31/2019  . Myocarditis (HCC)   . Pericardial effusion 12/14/2019   Formatting of this note might be different from the original. 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 No evidence of tamponade    Patient Active Problem List   Diagnosis Date Noted  . Atypical chest pain 01/11/2020  . Abdominal pain, chronic, epigastric 01/11/2020  . PVC's (premature ventricular contractions) 12/14/2019  . Anxiety, generalized 10/31/2019    Past Surgical History:  Procedure Laterality Date  . KNEE ARTHROSCOPY WITH ANTERIOR CRUCIATE LIGAMENT (ACL) REPAIR      Prior to Admission medications   Medication Sig Start Date End Date Taking? Authorizing Provider   Cholecalciferol 25 MCG (1000 UT) tablet Take by mouth.    [provider]  cyanocobalamin 1000 MCG tablet Take by mouth.    [provider]  desogestrel-ethinyl estradiol (APRI) 0.15-30 MG-MCG tablet Take 1 tablet by mouth as directed.    [provider]  ibuprofen (ADVIL) 400 MG tablet Take 1 tablet (400 mg total) by mouth every 6 (six) hours as needed for moderate pain. Patient not taking: Reported on 01/11/2020 09/29/19   Lurene Shadow, MD  omeprazole (PRILOSEC) 20 MG capsule Take 20 mg by mouth daily.    [provider]    Allergies Patient has no known allergies.  Family History  Problem Relation Age of Onset  . Hypertension Mother   . Hypertension Father     Social History Social History   Tobacco Use  . Smoking status: Never Smoker  . Smokeless tobacco: Never Used  Vaping Use  . Vaping Use: Never used  Substance Use Topics  . Alcohol use: Yes  . Drug use: Never    Review of Systems  Constitutional: No fever/chills Eyes: No visual changes. ENT: No sore throat. Cardiovascular: Positive for chest pain. Respiratory: Positive for cough and shortness of breath. Gastrointestinal: No abdominal pain.  No nausea, no vomiting.  No diarrhea.  No constipation. Genitourinary: Negative for dysuria. Musculoskeletal: Negative for back pain. Skin: Negative for rash. Neurological: Negative for headaches, focal weakness or numbness.  ____________________________________________   PHYSICAL EXAM:  VITAL SIGNS: ED Triage Vitals [01/20/20 0145]  Enc Vitals Group     BP  128/74     Pulse Rate 62     Resp 18     Temp 98 F (36.7 C)     Temp src      SpO2 100 %     Weight 140 lb (63.5 kg)     Height 5\' 7"  (1.702 m)     Head Circumference      Peak Flow      Pain Score 4     Pain Loc      Pain Edu?      Excl. in GC?     Constitutional: Alert and oriented. Eyes: Conjunctivae are normal. Head: Atraumatic. Nose: No  congestion/rhinnorhea. Mouth/Throat: Mucous membranes are moist. Neck: Normal ROM Cardiovascular: Normal rate, regular rhythm. Grossly normal heart sounds. Respiratory: Normal respiratory effort.  No retractions. Lungs CTAB. Gastrointestinal: Soft and nontender. No distention. Genitourinary: deferred Musculoskeletal: No lower extremity tenderness nor edema. Neurologic:  Normal speech and language. No gross focal neurologic deficits are appreciated. Skin:  Skin is warm, dry and intact. No rash noted. Psychiatric: Mood and affect are normal. Speech and behavior are normal.  ____________________________________________   LABS (all labs ordered are listed, but only abnormal results are displayed)  Labs Reviewed  BASIC METABOLIC PANEL - Abnormal; Notable for the following components:      Result Value   Calcium 8.4 (*)    All other components within normal limits  RESPIRATORY PANEL BY RT PCR (FLU A&B, COVID)  CBC  POC URINE PREG, ED  TROPONIN I (HIGH SENSITIVITY)  TROPONIN I (HIGH SENSITIVITY)   ____________________________________________  EKG  ED ECG REPORT I, , the attending physician, personally viewed and interpreted this ECG.   Date: 01/20/2020  EKG Time: 1:44  Rate: 78  Rhythm: normal sinus rhythm  Axis: RAD  Intervals:none  ST&T Change: None   PROCEDURES  Procedure(s) performed (including Critical Care):  Procedures   ____________________________________________   INITIAL IMPRESSION / ASSESSMENT AND PLAN / ED COURSE       24 year old female with past medical history of myocarditis who presents to the ED complaining of intermittent sharp pain in her chest for the past week.  EKG shows no evidence of arrhythmia or ischemia, initial troponin within normal limits.  I have a low suspicion for ACS given patient is young without any risk factors, myocarditis also unlikely with normal troponin.  Pericarditis is possible, but no characteristic changes  noted on EKG.  Chest x-ray reviewed by me and shows no infiltrate, edema, or effusion.  I doubt PE given intermittent symptoms over multiple months.  We will repeat troponin and if this is negative, patient will be appropriate for discharge home with cardiology follow-up.  Repeat troponin within normal limits.  Patient is appropriate for discharge home with cardiology follow-up.  She was counseled to return to the ED for new worsening symptoms, patient agrees with plan.      ____________________________________________   FINAL CLINICAL IMPRESSION(S) / ED DIAGNOSES  Final diagnoses:  Nonspecific chest pain     ED Discharge Orders    None       Note:  This document was prepared using Dragon voice recognition software and may include unintentional dictation errors.   21, MD 01/20/20 [phone]

## 2020-01-20 NOTE — ED Notes (Signed)
Assumed care of pt upon being roomed. Reports dx of myocarditis approximetely 3 months ago and has been followed by cardiology. Cardiologist states episodes of recent chest painare "flair ups." Pt denies cp at this time. States she is using sitting when the sharp and pressure sensations occur, radiating to back between scapula and down L arm.

## 2020-01-20 NOTE — ED Notes (Signed)
Patient transported to X-ray 

## 2020-01-20 NOTE — ED Notes (Signed)
DC reviewed by RN. Mom at bedside. Ambulated out of ED with steady gait.

## 2020-01-20 NOTE — ED Triage Notes (Signed)
Patient with complaint of left side chest pain radiating to her back with shortness of breath that started about 3 hours ago.

## 2020-01-22 LAB — H. PYLORI BREATH TEST: H pylori Breath Test: NEGATIVE

## 2020-01-23 ENCOUNTER — Telehealth: Payer: Self-pay

## 2020-01-23 NOTE — Telephone Encounter (Signed)
Both were released. I Gave patient both requisition at her office visit in [place] on 01/11/2020. I don't know why she did not get the IGG when she went to the lab. Do you want me to call her and tell patient to go get this done

## 2020-01-23 NOTE — Telephone Encounter (Signed)
-----   Message from Toney Reil, MD sent at 01/23/2020  4:39 PM EST ----- Is the H. pylori antibody order released.  I see the results of breath test only  RV

## 2020-02-19 ENCOUNTER — Encounter: Payer: Self-pay | Admitting: Gastroenterology

## 2020-02-19 DIAGNOSIS — R1013 Epigastric pain: Secondary | ICD-10-CM

## 2020-02-20 ENCOUNTER — Other Ambulatory Visit: Payer: Self-pay | Admitting: Gastroenterology

## 2020-02-20 MED ORDER — OMEPRAZOLE 40 MG PO CPDR: 40.0000 mg | DELAYED_RELEASE_CAPSULE | Freq: Two times a day (BID) | ORAL | 1 refills | Status: AC

## 2020-02-20 NOTE — Telephone Encounter (Signed)
Order the lab test and ultrasound with omeprazole 40mg . Patient verbalized understanding. She states she can do the ultrasound in January. If before Saturday can do anytime. Called and got her scheduled for 02/21/2020 [facility] for a 8:30am scan. Nothing to eat or drink after midnight. She verbalized understanding and states she will get her labs done there tomorrow also

## 2020-02-21 ENCOUNTER — Ambulatory Visit
Admission: RE | Admit: 2020-02-21 | Discharge: 2020-02-21 | Disposition: A | Discharge status: Home / Self Care | Payer: 59 | Source: Ambulatory Visit | Attending: Gastroenterology | Admitting: Gastroenterology

## 2020-02-21 ENCOUNTER — Other Ambulatory Visit
Admission: RE | Admit: 2020-02-21 | Discharge: 2020-02-21 | Disposition: A | Discharge status: Home / Self Care | Payer: 59 | Source: Ambulatory Visit | Attending: Gastroenterology | Admitting: Gastroenterology

## 2020-02-21 ENCOUNTER — Other Ambulatory Visit: Payer: Self-pay

## 2020-02-21 DIAGNOSIS — R1013 Epigastric pain: Secondary | ICD-10-CM | POA: Insufficient documentation

## 2020-02-22 LAB — H PYLORI, IGM, IGG, IGA AB
H Pylori IgG: 0.22 Index Value (ref 0.00–0.79)
H. Pylogi, Iga Abs: <9 units (ref 0.0–8.9)
H. Pylogi, Igm Abs: <9 units (ref 0.0–8.9)

## 2020-03-11 ENCOUNTER — Encounter: Payer: Self-pay | Admitting: Gastroenterology

## 2020-03-11 DIAGNOSIS — R197 Diarrhea, unspecified: Secondary | ICD-10-CM

## 2020-03-12 NOTE — Telephone Encounter (Signed)
Order GI profile per Provider request

## 2020-03-17 LAB — GI PROFILE, STOOL, PCR

## 2020-03-21 ENCOUNTER — Encounter: Payer: Self-pay | Admitting: Gastroenterology

## 2020-03-21 DIAGNOSIS — R1013 Epigastric pain: Secondary | ICD-10-CM

## 2020-03-21 DIAGNOSIS — R197 Diarrhea, unspecified: Secondary | ICD-10-CM

## 2020-03-24 ENCOUNTER — Other Ambulatory Visit
Admission: RE | Admit: 2020-03-24 | Discharge: 2020-03-24 | Disposition: A | Discharge status: Home / Self Care | Payer: 59 | Attending: Gastroenterology | Admitting: Gastroenterology

## 2020-03-24 DIAGNOSIS — R197 Diarrhea, unspecified: Secondary | ICD-10-CM | POA: Diagnosis not present

## 2020-03-24 DIAGNOSIS — R1013 Epigastric pain: Secondary | ICD-10-CM | POA: Diagnosis not present

## 2020-03-27 LAB — CELIAC DISEASE PANEL
Endomysial Ab, IgA: NEGATIVE
IgA: 112 mg/dL (ref 87–352)
Tissue Transglutaminase Ab, IgA: <2 U/mL (ref 0–3)

## 2020-04-14 ENCOUNTER — Other Ambulatory Visit: Payer: Self-pay

## 2020-04-17 ENCOUNTER — Ambulatory Visit (INDEPENDENT_AMBULATORY_CARE_PROVIDER_SITE_OTHER): Payer: 59 | Admitting: Gastroenterology

## 2020-04-17 ENCOUNTER — Encounter: Payer: Self-pay | Admitting: Gastroenterology

## 2020-04-17 ENCOUNTER — Other Ambulatory Visit: Payer: Self-pay

## 2020-04-17 VITALS — BP 123/73 | HR 74 | Ht 67.0 in | Wt 147.8 lb

## 2020-04-17 DIAGNOSIS — K580 Irritable bowel syndrome with diarrhea: Secondary | ICD-10-CM | POA: Diagnosis not present

## 2020-04-17 DIAGNOSIS — K589 Irritable bowel syndrome without diarrhea: Secondary | ICD-10-CM | POA: Insufficient documentation

## 2020-04-17 NOTE — Patient Instructions (Signed)
Align samples given today in office.

## 2020-04-17 NOTE — Progress Notes (Signed)
Cephas Darby, MD [address]  [place]  [place], [place] [address]  Main: [phone]  Fax: [phone]    Gastroenterology Consultation  Referring Provider:     Gladstone Lighter, MD Primary Care Physician:  Gladstone Lighter, MD Primary Gastroenterologist:  Dr. Cephas Darby Reason for Consultation:    Diarrhea predominant IBS        HPI:   Carol Moody is a 25 y.o. female referred by Dr. Gladstone Lighter, MD  for consultation & management of atypical chest pain.  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  She is closely followed by Dr. Ubaldo Glassing this time.  She underwent cardiac MRI with no evidence of myocarditis.  She also underwent Holter monitoring which was negative.  Treadmill test revealed PVCs and started on metoprolol.  She was briefly on call discern for myocarditis and that was discontinued due to GI upset.  She went to ER in early September secondary to left-sided chest pain, cardiac etiology was ruled out.  She has been taking omeprazole 20 mg daily which did provide some relief.  Patient has history of anxiety but she reports that it is under control.  She was more anxious when she was diagnosed with myocarditis and she was on Zoloft at that time.  Currently, she is not taking Zoloft.  She is currently working in 2 jobs, graduated from college last year.  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 She also reports mild epigastric discomfort.  She does experience mild intermittent left lower abdominal pain.  She reports having regular bowel movements, denies any rectal bleeding Labs are unremarkable, ESR CRP normal, HIV negative She does have mild B12 deficiency for which she is taking oral B12 supplements daily  Follow-up visit 04/17/20 Patient reports intermittent episodes of left lower quadrant pain associated with 3-4 nonbloody, soft to loose bowel movements.  Her PCP advised her to start Bentyl.  She wanted to  discuss about it with me.  She has celiac panel negative, stool studies were negative for infection.  H. pylori IgG was negative.  She is also seeing rheumatologist for arthralgias.   NSAIDs: None  Antiplts/Anticoagulants/Anti thrombotics: None  GI Procedures: None She denies family history of IBD, GI malignancy  Past Medical History:  Diagnosis Date  . Chest pain at rest 10/31/2019  . Myocarditis ([facility])   . Pericardial effusion 12/14/2019   Formatting of this note might be different from the original. 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 No evidence of tamponade    Past Surgical History:  Procedure Laterality Date  . KNEE ARTHROSCOPY WITH ANTERIOR CRUCIATE LIGAMENT (ACL) REPAIR      Current Outpatient Medications:  .  Cholecalciferol 25 MCG (1000 UT) tablet, Take by mouth., Disp: , Rfl:  .  cyanocobalamin 1000 MCG tablet, Take by mouth., Disp: , Rfl:  .  desogestrel-ethinyl estradiol (APRI) 0.15-30 MG-MCG tablet, Take 1 tablet by mouth as directed., Disp: , Rfl:  .  dicyclomine (BENTYL) 10 MG capsule, Take by mouth. (Patient not taking: Reported on 04/17/2020), Disp: , Rfl:  .  ibuprofen (ADVIL) 400 MG tablet, Take 1 tablet (400 mg total) by mouth every 6 (six) hours as needed for moderate pain. (Patient not taking: No sig reported), Disp: , Rfl:  .  metoprolol succinate (TOPROL-XL) 25 MG 24 hr tablet, Take by mouth. (Patient not taking: Reported on 04/17/2020), Disp: , Rfl:  .  omeprazole (PRILOSEC) 40 MG capsule, Take 1  capsule (40 mg total) by mouth 2 (two) times daily before a meal., Disp: 60 capsule, Rfl: 1   Family History  Problem Relation Age of Onset  . Hypertension Mother   . Hypertension Father      Social History   Tobacco Use  . Smoking status: Never Smoker  . Smokeless tobacco: Never Used  Vaping Use  . Vaping Use: Never used  Substance Use Topics  . Alcohol use: Yes  . Drug use: Never    Allergies as of 04/17/2020  . (No Known  Allergies)    Review of Systems:    All systems reviewed and negative except where noted in HPI.   Physical Exam:  BP 123/73 (BP Location: Right Arm, Patient Position: Sitting, Cuff Size: Normal)   Pulse 74   Ht 5' 7" (1.702 m)   Wt 147 lb 12.8 oz (67 kg)   BMI 23.15 kg/m  No LMP recorded.  General:   Alert,  Well-developed, well-nourished, pleasant and cooperative in NAD Head:  Normocephalic and atraumatic. Eyes:  Sclera clear, no icterus.   Conjunctiva pink. Ears:  Normal auditory acuity. Nose:  No deformity, discharge, or lesions. Mouth:  No deformity or lesions,oropharynx pink & moist. Neck:  Supple; no masses or thyromegaly. Lungs:  Respirations even and unlabored.  Clear throughout to auscultation.   No wheezes, crackles, or rhonchi. No acute distress. Heart:  Regular rate and rhythm; no murmurs, clicks, rubs, or gallops. Abdomen:  Normal bowel sounds. Soft, mild left lower quadrant tenderness, mildly distended, tympanic to percussion without masses, hepatosplenomegaly or hernias noted.  No guarding or rebound tenderness.   Rectal: Not performed Msk:  Symmetrical without gross deformities. Good, equal movement & strength bilaterally. Pulses:  Normal pulses noted. Extremities:  No clubbing or edema.  No cyanosis. Neurologic:  Alert and oriented x3;  grossly normal neurologically. Skin:  Intact without significant lesions or rashes. No jaundice. Lymph Nodes:  No significant cervical adenopathy. Psych:  Alert and cooperative. Normal mood and affect.  Imaging Studies: No abdominal imaging  Assessment and Plan:   Carol Moody is a 25 y.o. female with history of anxiety, recovered from viral myocarditis in 10/2019, currently on metoprolol, atypical chest pain for which cardiac etiology has been ruled out.  Patient has intermittent left lower quadrant pain associated with nonbloody diarrhea.  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  Patient prefers to try Bentyl at this time Align probiotic samples were provided   Follow up based on above work-up   Cephas Darby, MD

## 2020-04-21 ENCOUNTER — Encounter: Payer: Self-pay | Admitting: Gastroenterology

## 2020-08-01 ENCOUNTER — Ambulatory Visit
Admission: EM | Admit: 2020-08-01 | Discharge: 2020-08-01 | Disposition: A | Discharge status: Home / Self Care | Payer: 59 | Attending: Emergency Medicine | Admitting: Emergency Medicine

## 2020-08-01 DIAGNOSIS — L03114 Cellulitis of left upper limb: Secondary | ICD-10-CM | POA: Diagnosis not present

## 2020-08-01 DIAGNOSIS — J069 Acute upper respiratory infection, unspecified: Secondary | ICD-10-CM

## 2020-08-01 MED ORDER — DOXYCYCLINE HYCLATE 100 MG PO CAPS: 100.0000 mg | ORAL_CAPSULE | Freq: Two times a day (BID) | ORAL | 0 refills | Status: AC

## 2020-08-01 NOTE — Discharge Instructions (Signed)
Take the doxycycline as directed.    Keep your wound clean and dry.  Wash it gently twice a day with soap and water.  Apply an antibiotic cream twice a day.    Follow-up with your primary care provider or return here if you see signs of infection, such as increased redness, pus-like drainage, warmth, fever, chills, or other concerning symptoms.    Take Tylenol or ibuprofen as needed for fever or discomfort.  Take plain over-the-counter Mucinex as needed for congestion.  Follow up with your primary care provider if your symptoms are not improving.

## 2020-08-01 NOTE — ED Triage Notes (Signed)
Patient presents to Urgent Care with complaints of cough and rash x 5 days ago. Applying hydrocortisone cream to rash. Pt states the rash is from a bug bite that later developed into a rash located on left [facility] area.   Denies fever.

## 2020-08-01 NOTE — ED Provider Notes (Signed)
Carol Moody    CSN: [id number] Arrival date & time: 08/01/20  5732      History   Chief Complaint Chief Complaint  Patient presents with  . Cough  . Rash    HPI Carol Moody is a 25 y.o. female.   Patient presents with an insect bite with surrounding redness on her left arm for 5 days.  The redness has improved over the past couple of days and is not spreading.  She has been putting hydrocortisone cream on it without relief.  She did not see what insect bit her.  No known tick exposure.  She also reports cough and congestion for 5 days.  She denies fever, other rash, shortness of breath, vomiting, diarrhea, or other symptoms.  No treatments attempted at home.  Her medical history includes pericardial effusion, myocarditis, chronic low back pain, anxiety, IBS.  The history is provided by the patient and medical records.    Past Medical History:  Diagnosis Date  . Abdominal pain, chronic, epigastric 01/11/2020  . Atypical chest pain 01/11/2020  . Chest pain at rest 10/31/2019  . Myocarditis ([facility])   . Pericardial effusion 12/14/2019   Formatting of this note might be different from the original. 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 No evidence of tamponade    Patient Active Problem List   Diagnosis Date Noted  . IBS (irritable bowel syndrome) 04/17/2020  . PVC's (premature ventricular contractions) 12/14/2019  . Anxiety, generalized 10/31/2019    Past Surgical History:  Procedure Laterality Date  . KNEE ARTHROSCOPY WITH ANTERIOR CRUCIATE LIGAMENT (ACL) REPAIR      OB History   No obstetric history on file.      Home Medications    Prior to Admission medications   Medication Sig Start Date End Date Taking? Authorizing Provider  doxycycline (VIBRAMYCIN) 100 MG capsule Take 1 capsule (100 mg total) by mouth 2 (two) times daily. 08/01/20  Yes Mickie Bail, NP  Cholecalciferol 25 MCG (1000 UT) tablet Take by mouth.    [provider]   cyanocobalamin 1000 MCG tablet Take by mouth.    [provider]  desogestrel-ethinyl estradiol (APRI) 0.15-30 MG-MCG tablet Take 1 tablet by mouth as directed.    [provider]  dicyclomine (BENTYL) 10 MG capsule Take by mouth. Patient not taking: Reported on 04/17/2020    [provider]  ibuprofen (ADVIL) 400 MG tablet Take 1 tablet (400 mg total) by mouth every 6 (six) hours as needed for moderate pain. Patient not taking: No sig reported 09/29/19   Lurene Shadow, MD  metoprolol succinate (TOPROL-XL) 25 MG 24 hr tablet Take by mouth. Patient not taking: Reported on 04/17/2020    [provider]  omeprazole (PRILOSEC) 40 MG capsule Take 1 capsule (40 mg total) by mouth 2 (two) times daily before a meal. 02/20/20 03/21/20  Vanga, Loel Dubonnet, MD    Family History Family History  Problem Relation Age of Onset  . Hypertension Mother   . Hypertension Father     Social History Social History   Tobacco Use  . Smoking status: Never Smoker  . Smokeless tobacco: Never Used  Vaping Use  . Vaping Use: Never used  Substance Use Topics  . Alcohol use: Yes  . Drug use: Never     Allergies   Patient has no known allergies.   Review of Systems Review of Systems  Constitutional: Negative for chills and fever.  HENT: Positive for congestion. Negative for  ear pain and sore throat.   Respiratory: Positive for cough. Negative for shortness of breath.   Cardiovascular: Negative for chest pain and palpitations.  Gastrointestinal: Negative for abdominal pain and vomiting.  Musculoskeletal: Negative for arthralgias and joint swelling.  Skin: Positive for color change and wound.  Neurological: Negative for weakness and numbness.  All other systems reviewed and are negative.    Physical Exam Triage Vital Signs ED Triage Vitals  Enc Vitals Group     BP      Pulse      Resp      Temp      Temp src      SpO2      Weight      Height      Head  Circumference      Peak Flow      Pain Score      Pain Loc      Pain Edu?      Excl. in GC?    No data found.  Updated Vital Signs BP 112/65 (BP Location: Right Arm)   Pulse 72   Temp (!) 97.5 F (36.4 C) (Temporal)   Resp 16   SpO2 98%   Visual Acuity Right Eye Distance:   Left Eye Distance:   Bilateral Distance:    Right Eye Near:   Left Eye Near:    Bilateral Near:     Physical Exam Vitals and nursing note reviewed.  Constitutional:      General: She is not in acute distress.    Appearance: She is well-developed. She is not ill-appearing.  HENT:     Head: Normocephalic and atraumatic.     Right Ear: Tympanic membrane normal.     Left Ear: Tympanic membrane normal.     Nose: Nose normal.     Mouth/Throat:     Mouth: Mucous membranes are moist.     Pharynx: Oropharynx is clear.  Eyes:     Conjunctiva/sclera: Conjunctivae normal.  Cardiovascular:     Rate and Rhythm: Normal rate and regular rhythm.     Heart sounds: Normal heart sounds.  Pulmonary:     Effort: Pulmonary effort is normal. No respiratory distress.     Breath sounds: Normal breath sounds.  Abdominal:     Palpations: Abdomen is soft.     Tenderness: There is no abdominal tenderness.  Musculoskeletal:        General: No swelling. Normal range of motion.     Cervical back: Neck supple.  Skin:    General: Skin is warm and dry.     Capillary Refill: Capillary refill takes less than 2 seconds.     Findings: Erythema and lesion present.     Comments: See picture for details.  Neurological:     General: No focal deficit present.     Mental Status: She is alert and oriented to person, place, and time.     Sensory: No sensory deficit.     Motor: No weakness.     Gait: Gait normal.  Psychiatric:        Mood and Affect: Mood normal.        Behavior: Behavior normal.        UC Treatments / Results  Labs (all labs ordered are listed, but only abnormal results are displayed) Labs Reviewed -  No data to display  EKG   Radiology No results found.  Procedures Procedures (including critical care time)  Medications Ordered in UC Medications -  No data to display  Initial Impression / Assessment and Plan / UC Course  I have reviewed the triage vital signs and the nursing notes.  Pertinent labs & imaging results that were available during my care of the patient were reviewed by me and considered in my medical decision making (see chart for details).   Cellulitis of left arm.  Viral URI.  Patient declines blood work for tickborne illnesses.  She also declines COVID test.  Treating cellulitis with doxycycline.  Wound care instructions and signs of worsening infection discussed.  Discussed also symptomatic treatment for her cough and congestion.  Instructed her to follow-up with her PCP if her symptoms are not improving.  She agrees to plan of care.   Final Clinical Impressions(s) / UC Diagnoses   Final diagnoses:  Cellulitis of left upper extremity  Viral upper respiratory tract infection     Discharge Instructions     Take the doxycycline as directed.    Keep your wound clean and dry.  Wash it gently twice a day with soap and water.  Apply an antibiotic cream twice a day.    Follow-up with your primary care provider or return here if you see signs of infection, such as increased redness, pus-like drainage, warmth, fever, chills, or other concerning symptoms.    Take Tylenol or ibuprofen as needed for fever or discomfort.  Take plain over-the-counter Mucinex as needed for congestion.  Follow up with your primary care provider if your symptoms are not improving.         ED Prescriptions    Medication Sig Dispense Auth. Provider   doxycycline (VIBRAMYCIN) 100 MG capsule Take 1 capsule (100 mg total) by mouth 2 (two) times daily. 20 capsule Mickie Bail, NP     PDMP not reviewed this encounter.   Mickie Bail, NP 08/01/20 [phone]

## 2020-10-12 IMAGING — CR DG CHEST 2V
2 series · 2 of 2 positions shown · non-contrast
Comparison: 10/27/2019

CLINICAL DATA: Chest pain radiating to both arms

EXAM:
CHEST - 2 VIEW

[chest pa]
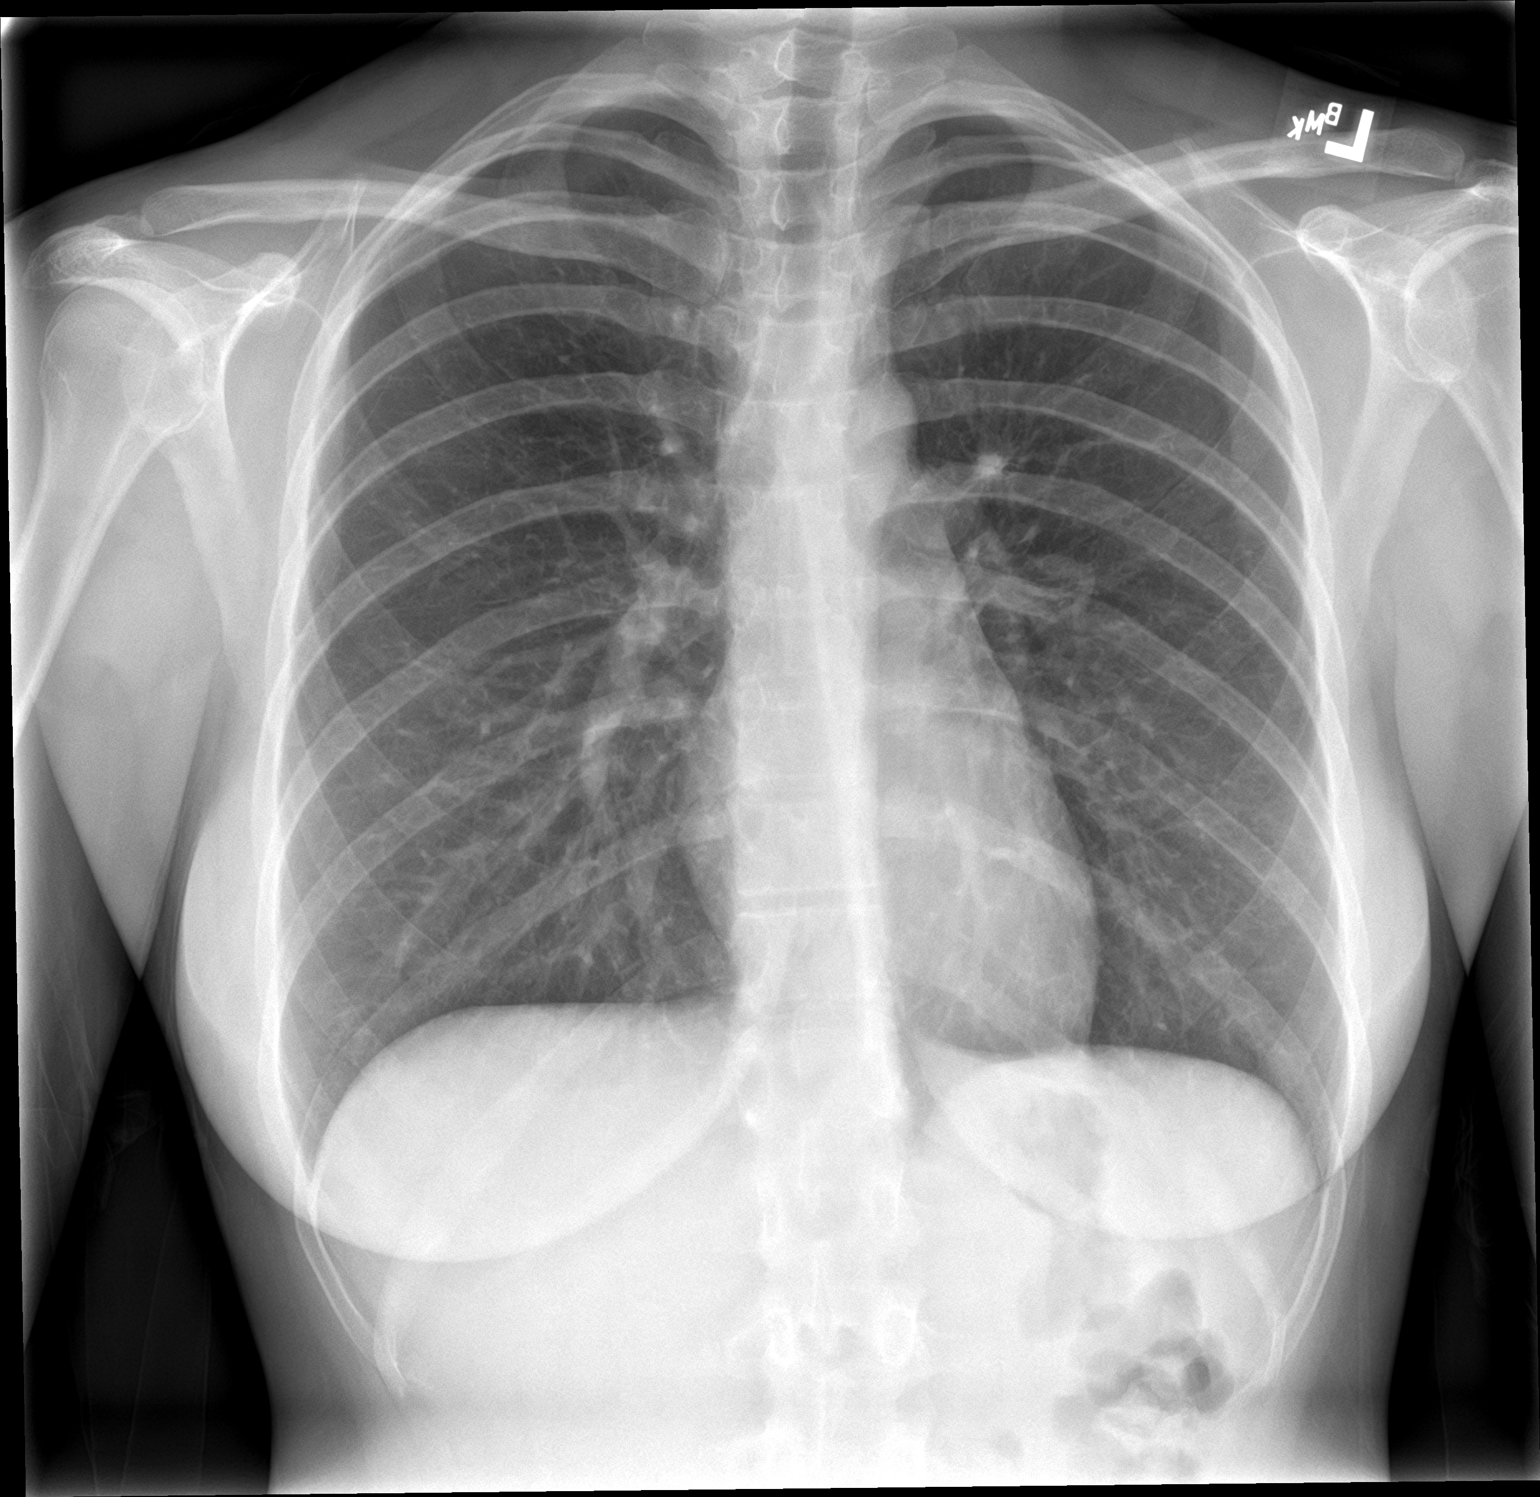

[chest lat]
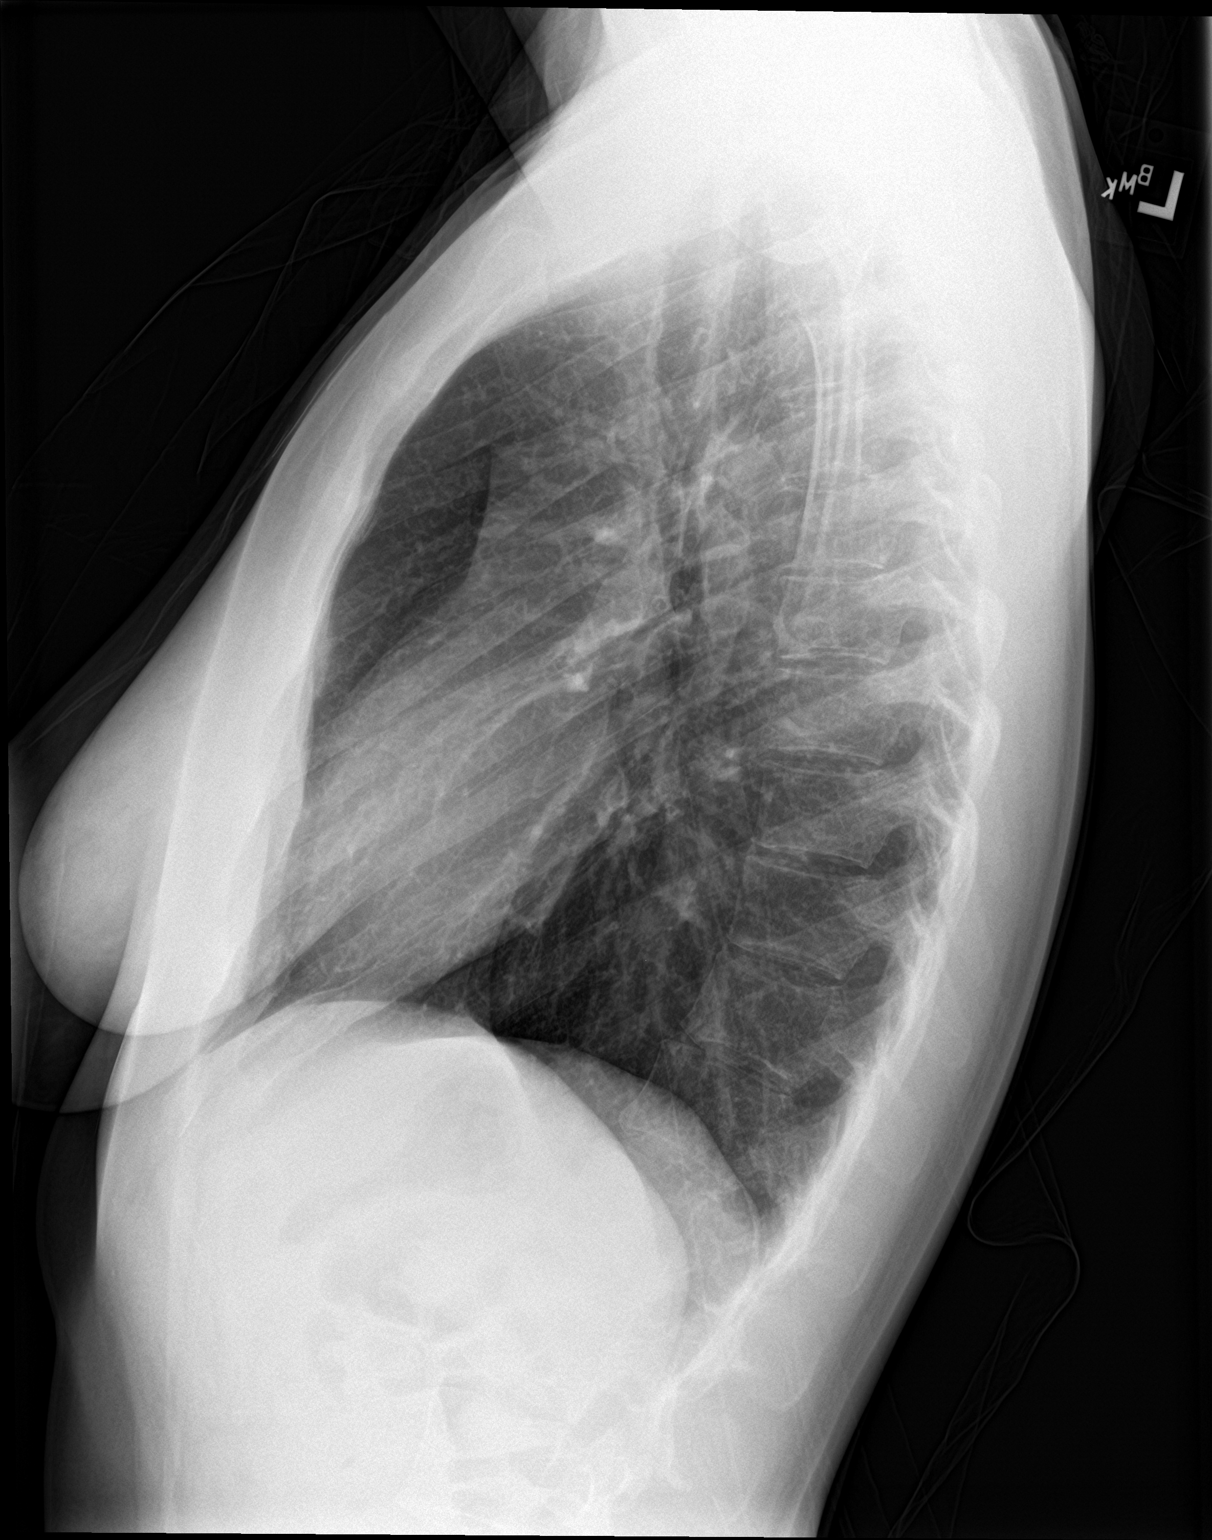

[2 of 2 positions shown; findings below may reference images not displayed]

FINDINGS: The heart size and mediastinal contours are within normal limits.
Both lungs are clear. The visualized skeletal structures are
unremarkable.
IMPRESSION: No active cardiopulmonary disease.

## 2021-01-24 IMAGING — US US ABDOMEN LIMITED
1 series · 14 of 25 positions shown · non-contrast
Comparison: None.

CLINICAL DATA: Upper abdominal pain

EXAM:
ULTRASOUND ABDOMEN LIMITED RIGHT UPPER QUADRANT

[Series 1: us abdomen limited ruq (liver/gb) · 14 of 63 slices shown]
[im 1/63]
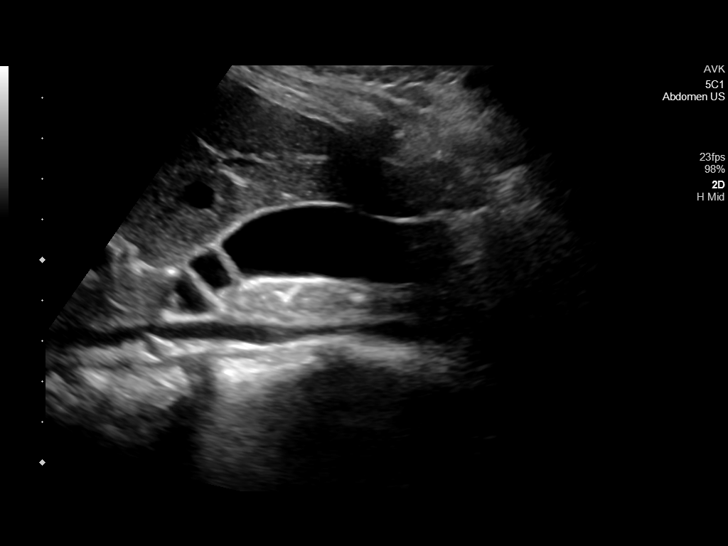
[im 6/63]
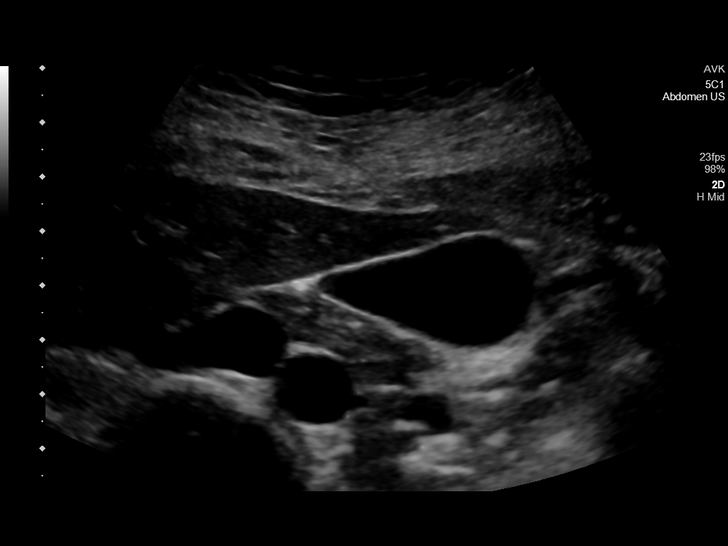
[im 11/63]
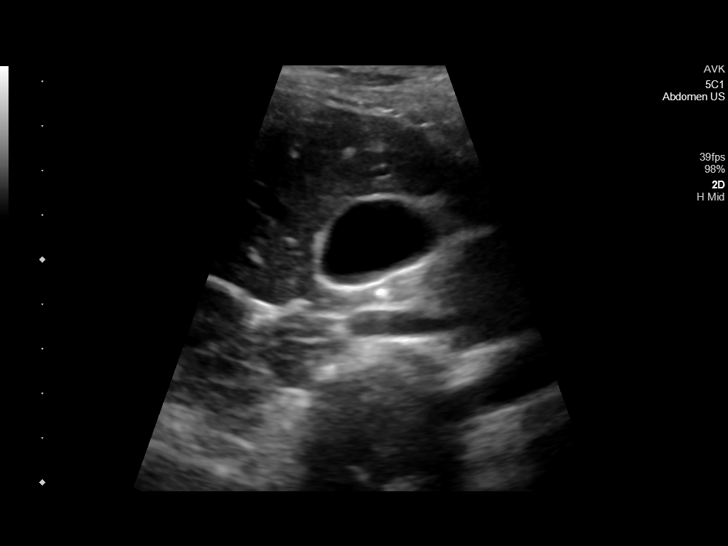
[im 16/63]
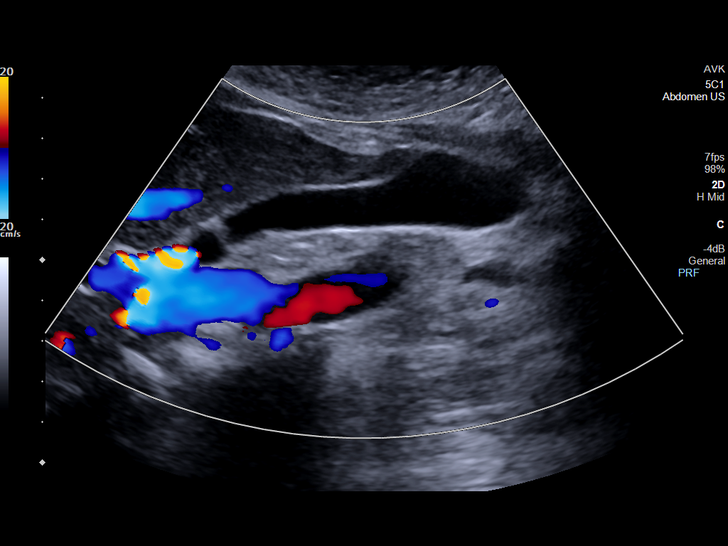
[im 21/63]
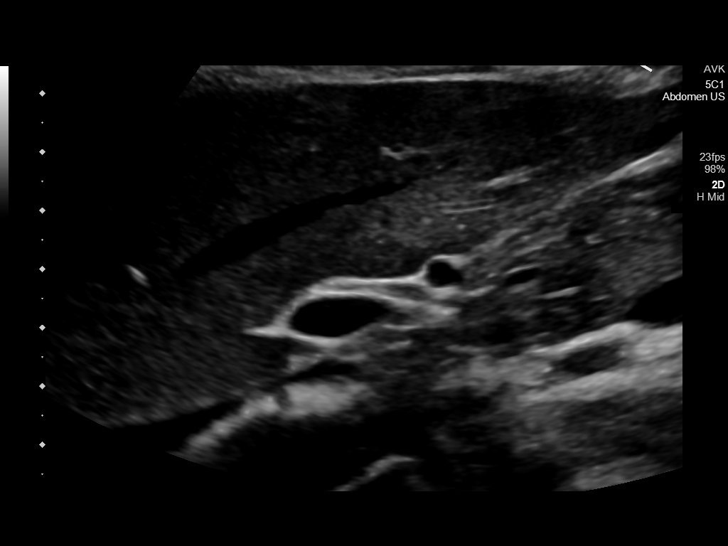
[im 24/63]
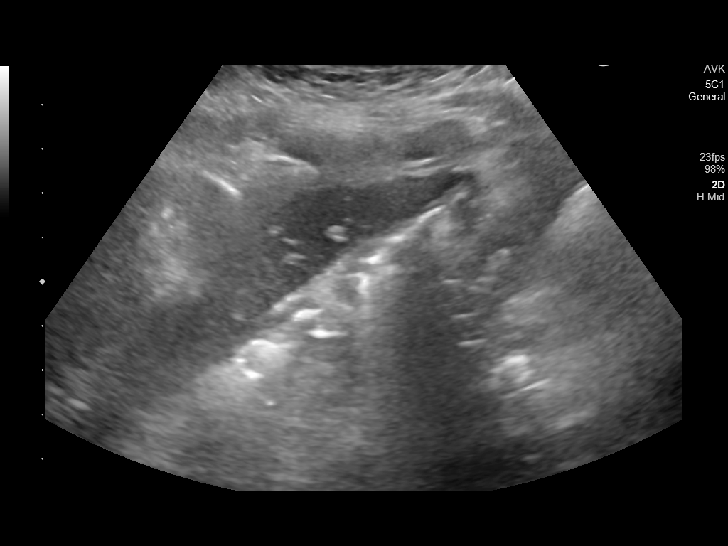
[im 29/63]
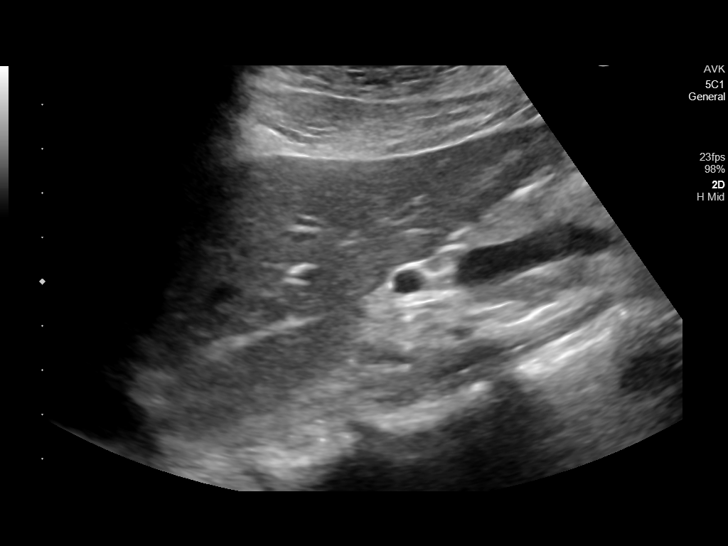
[im 34/63]
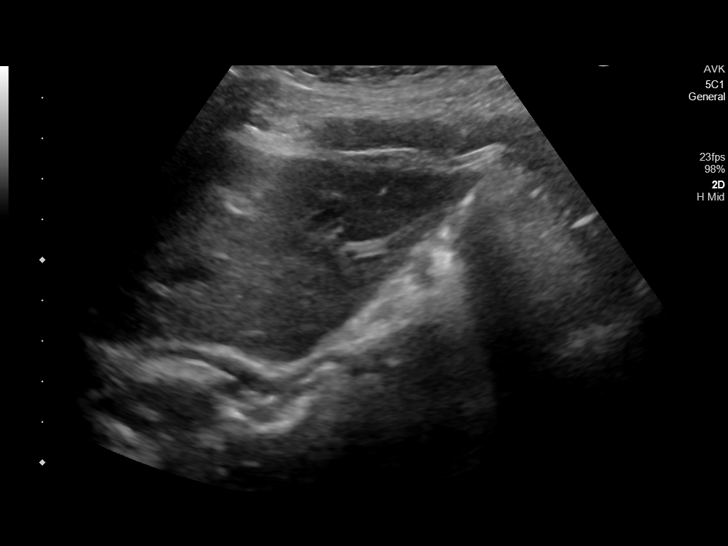
[im 39/63]
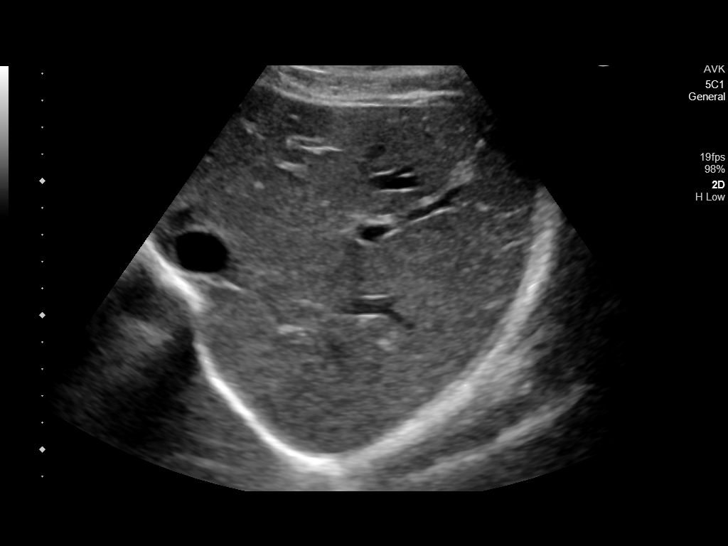
[im 42/63]
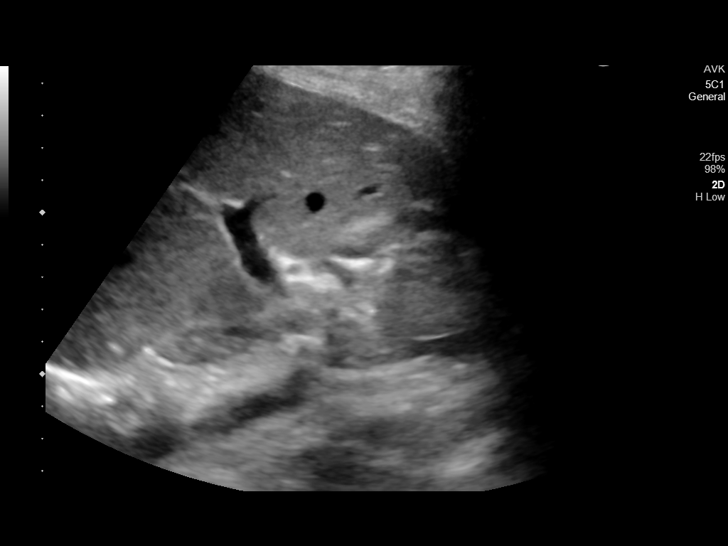
[im 47/63]
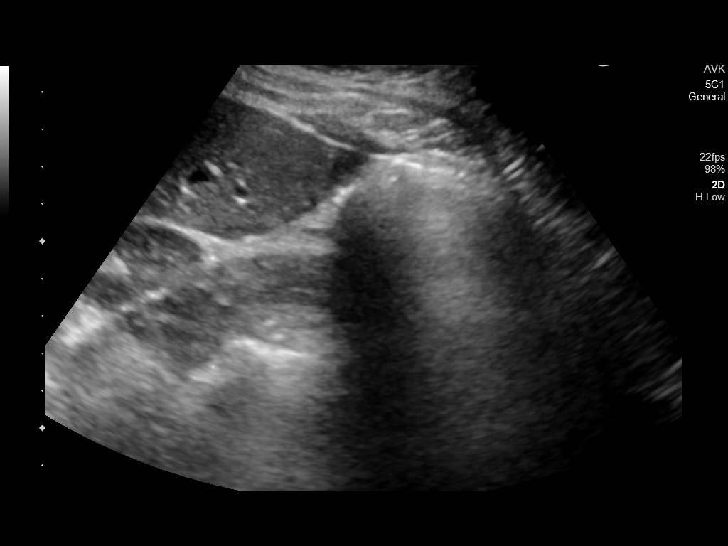
[im 52/63]
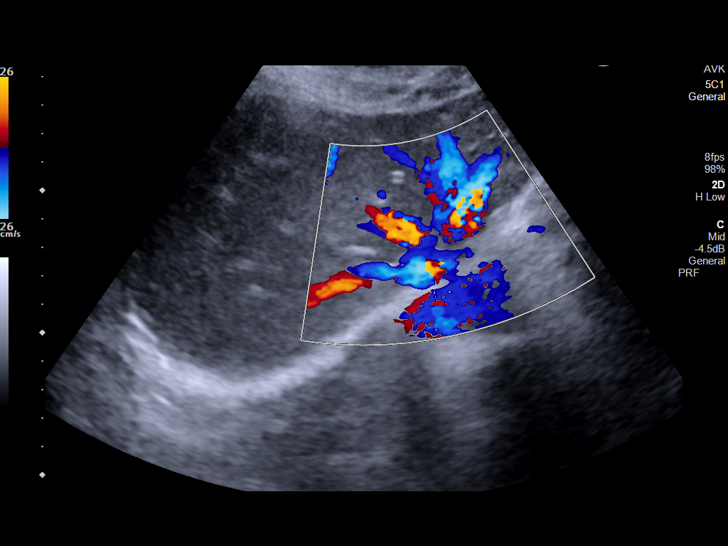
[im 57/63]
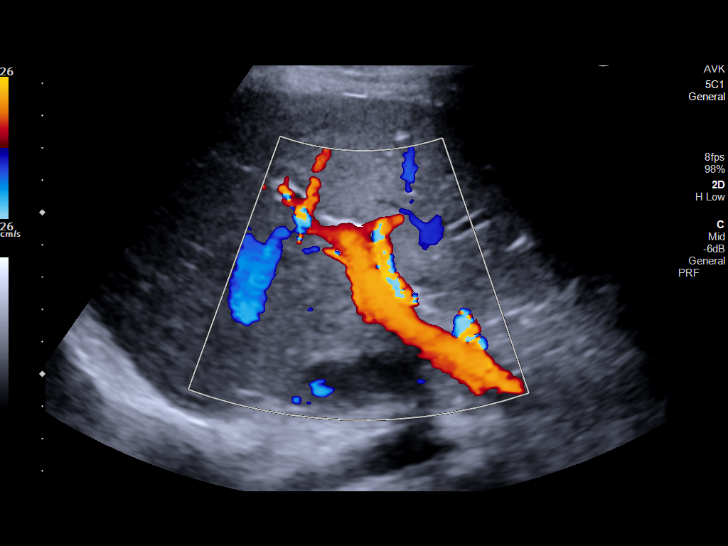
[im 63/63]
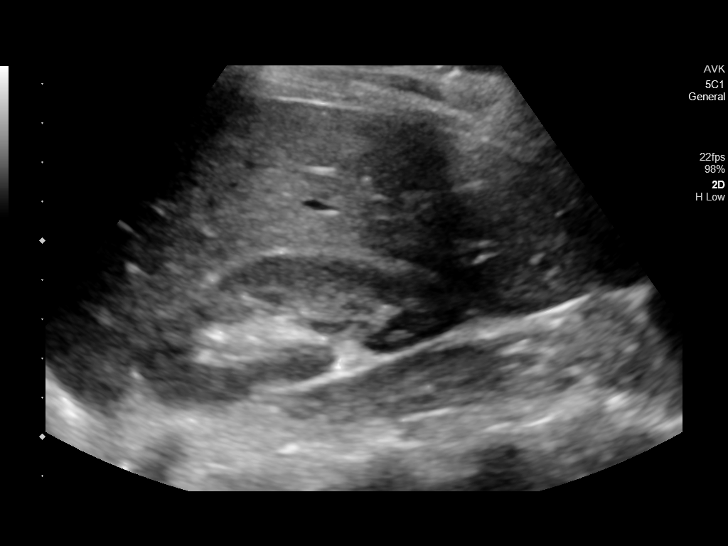

[14 of 25 positions shown; findings below may reference images not displayed]

FINDINGS: Gallbladder:

No gallstones or wall thickening visualized there is no
pericholecystic fluid. No sonographic Murphy sign noted by
sonographer.

Common bile duct:

Diameter: 2 mm. No intrahepatic or extrahepatic biliary duct
dilatation.

Liver:

No focal lesion identified. Within normal limits in parenchymal
echogenicity. Portal vein is patent on color Doppler imaging with
normal direction of blood flow towards the liver.

Other: None.
IMPRESSION: Study within normal limits.
